# Patient Record
Sex: Female | Born: 1970 | Race: Black or African American | Hispanic: No | Marital: Married | Smoking: Never smoker
Health system: Southern US, Community
[De-identification: ages and names within clinical notes are randomized; demographics above are authoritative.]

## PROBLEM LIST (undated history)

## (undated) DIAGNOSIS — R011 Cardiac murmur, unspecified: Secondary | ICD-10-CM

## (undated) DIAGNOSIS — D649 Anemia, unspecified: Secondary | ICD-10-CM

## (undated) DIAGNOSIS — R87619 Unspecified abnormal cytological findings in specimens from cervix uteri: Secondary | ICD-10-CM

## (undated) DIAGNOSIS — IMO0002 Reserved for concepts with insufficient information to code with codable children: Secondary | ICD-10-CM

## (undated) DIAGNOSIS — I341 Nonrheumatic mitral (valve) prolapse: Secondary | ICD-10-CM

## (undated) DIAGNOSIS — Z5189 Encounter for other specified aftercare: Secondary | ICD-10-CM

## (undated) DIAGNOSIS — N946 Dysmenorrhea, unspecified: Secondary | ICD-10-CM

## (undated) HISTORY — DX: Dysmenorrhea, unspecified: N94.6

## (undated) HISTORY — PX: DILATION AND CURETTAGE OF UTERUS: SHX78

## (undated) HISTORY — DX: Reserved for concepts with insufficient information to code with codable children: IMO0002

## (undated) HISTORY — PX: BREAST ENHANCEMENT SURGERY: SHX7

## (undated) HISTORY — DX: Anemia, unspecified: D64.9

## (undated) HISTORY — PX: OTHER SURGICAL HISTORY: SHX169

## (undated) HISTORY — DX: Cardiac murmur, unspecified: R01.1

## (undated) HISTORY — DX: Nonrheumatic mitral (valve) prolapse: I34.1

## (undated) HISTORY — DX: Unspecified abnormal cytological findings in specimens from cervix uteri: R87.619

## (undated) HISTORY — DX: Encounter for other specified aftercare: Z51.89

---

## 1992-05-15 DIAGNOSIS — Z5189 Encounter for other specified aftercare: Secondary | ICD-10-CM

## 1992-05-15 HISTORY — DX: Encounter for other specified aftercare: Z51.89

## 1997-05-15 HISTORY — PX: CRYOTHERAPY: SHX1416

## 2013-01-30 ENCOUNTER — Ambulatory Visit (INDEPENDENT_AMBULATORY_CARE_PROVIDER_SITE_OTHER): Payer: BC Managed Care – PPO | Admitting: Certified Nurse Midwife

## 2013-01-30 ENCOUNTER — Encounter: Payer: Self-pay | Admitting: Certified Nurse Midwife

## 2013-01-30 VITALS — BP 100/64 | HR 64 | Resp 16 | Ht 66.75 in | Wt 135.0 lb

## 2013-01-30 DIAGNOSIS — Z0000 Encounter for general adult medical examination without abnormal findings: Secondary | ICD-10-CM

## 2013-01-30 DIAGNOSIS — Z800 Family history of malignant neoplasm of digestive organs: Secondary | ICD-10-CM

## 2013-01-30 DIAGNOSIS — Z01419 Encounter for gynecological examination (general) (routine) without abnormal findings: Secondary | ICD-10-CM

## 2013-01-30 NOTE — Progress Notes (Signed)
42 y.o. [id number] Married African American Fe here to establish gyn care and for annual exam. Periods normal, no issues. History of Cryo for abnormal pap smear in 1999. Patient had recent aex with PCP with all labs( normal per patient)  Prior to move here. No health issues today.   Patient's last menstrual period was 01/27/2013.          Sexually active: yes  The current method of family planning is vasectomy.    Exercising: yes  walk, ballet,yoga Smoker:  no  Health Maintenance: Pap:  2012 negative Last aex maybe 2013 MMG:  none Colonoscopy:  none BMD:   none TDaP:  2010 Labs: none Self breast exam: done monthly History of abnormal Pap with Cryo 1999   reports that she has never smoked. She does not have any smokeless tobacco history on file. She reports that she drinks about 0.5 ounces of alcohol per week. She reports that she does not use illicit drugs.  Past Medical History  Diagnosis Date  . Anemia   . Blood transfusion without reported diagnosis 1994  . Dysmenorrhea   . Heart murmur     during pregnancy  . Abnormal Pap smear     Past Surgical History  Procedure Laterality Date  . Cryotherapy  1999  . Uterine polyps removed    . Dilation and curettage of uterus      No current outpatient prescriptions on file.   No current facility-administered medications for this visit.    Family History  Problem Relation Age of Onset  . Cancer Brother     colon  . Thyroid disease Maternal Aunt   . Heart disease Maternal Aunt   . Thyroid disease Maternal Grandmother   . Heart disease Maternal Grandmother   . Heart disease Maternal Aunt     ROS:  Pertinent items are noted in HPI.  Otherwise, a comprehensive ROS was negative.  Exam:   BP 100/64  Pulse 64  Resp 16  Ht 5' 6.75" (1.695 m)  Wt 135 lb (61.236 kg)  BMI 21.31 kg/m2  LMP 01/27/2013 Height: 5' 6.75" (169.5 cm)  Ht Readings from Last 3 Encounters:  01/30/13 5' 6.75" (1.695 m)    General appearance: alert,  cooperative and appears stated age Head: Normocephalic, without obvious abnormality, atraumatic Neck: no adenopathy, supple, symmetrical, trachea midline and thyroid normal to inspection and palpation Lungs: clear to auscultation bilaterally Breasts: normal appearance, no masses or tenderness, No nipple retraction or dimpling, No nipple discharge or bleeding, No axillary or supraclavicular adenopathy, saline implants noted appear intact Heart: regular rate and rhythm Abdomen: soft, non-tender; no masses,  no organomegaly Extremities: extremities normal, atraumatic, no cyanosis or edema Skin: Skin color, texture, turgor normal. No rashes or lesions Lymph nodes: Cervical, supraclavicular, and axillary nodes normal. No abnormal inguinal nodes palpated Neurologic: Grossly normal   Pelvic: External genitalia:  no lesions              Urethra:  normal appearing urethra with no masses, tenderness or lesions              Bartholin's and Skene's: normal                 Vagina: normal appearing vagina with normal color and discharge, no lesions              Cervix: normal, non tender, bleeding with pap only              Pap taken:  yes Bimanual Exam:  Uterus:  normal size, contour, position, consistency, mobility, non-tender and anteverted              Adnexa: normal adnexa and no mass, fullness, tenderness               Rectovaginal: Confirms               Anus:  normal sphincter tone, no lesions  A:  Well Woman with normal exam  Contraception spouse vasectomy  Family history of colon cancer, brother 83  History of abnormal pap with cryo 1999 ?etiology    P:   Reviewed health and wellness pertinent to exam  DIscussed recommendation of colonoscopy with family history. Patient desires referral.  Stressed importance of aex   Pap smear as per guidelines   Mammogram yearly starting now,given information to schedule pap smear taken today with [facility]  counseled on breast self exam, mammography  screening, adequate intake of calcium and vitamin D, diet and exercise  return annually or prn  An After Visit Summary was printed and given to the patient.

## 2013-01-30 NOTE — Patient Instructions (Signed)

## 2013-02-03 LAB — IPS PAP TEST WITH HPV

## 2013-02-04 NOTE — Progress Notes (Signed)
Note reviewed, agree with plan.  Darius Lundberg, MD  

## 2013-07-14 ENCOUNTER — Other Ambulatory Visit: Payer: Self-pay | Admitting: Family Medicine

## 2013-07-14 DIAGNOSIS — Z1231 Encounter for screening mammogram for malignant neoplasm of breast: Secondary | ICD-10-CM

## 2013-08-05 ENCOUNTER — Ambulatory Visit
Admission: RE | Admit: 2013-08-05 | Discharge: 2013-08-05 | Disposition: A | Discharge status: Home / Self Care | Payer: BC Managed Care – PPO | Source: Ambulatory Visit | Attending: Family Medicine | Admitting: Family Medicine

## 2013-08-05 ENCOUNTER — Other Ambulatory Visit: Payer: Self-pay | Admitting: Family Medicine

## 2013-08-05 DIAGNOSIS — Z1231 Encounter for screening mammogram for malignant neoplasm of breast: Secondary | ICD-10-CM

## 2014-01-20 ENCOUNTER — Ambulatory Visit
Admission: RE | Admit: 2014-01-20 | Discharge: 2014-01-20 | Disposition: A | Discharge status: Home / Self Care | Payer: BC Managed Care – PPO | Source: Ambulatory Visit | Attending: Family Medicine | Admitting: Family Medicine

## 2014-01-20 ENCOUNTER — Other Ambulatory Visit: Payer: Self-pay | Admitting: Family Medicine

## 2014-01-20 DIAGNOSIS — R52 Pain, unspecified: Secondary | ICD-10-CM

## 2014-03-16 ENCOUNTER — Encounter: Payer: Self-pay | Admitting: Certified Nurse Midwife

## 2015-07-13 ENCOUNTER — Emergency Department (HOSPITAL_COMMUNITY): Payer: BLUE CROSS/BLUE SHIELD

## 2015-07-13 ENCOUNTER — Emergency Department (HOSPITAL_COMMUNITY)
Admission: EM | Admit: 2015-07-13 | Discharge: 2015-07-13 | Disposition: A | Discharge status: Home / Self Care | Payer: BLUE CROSS/BLUE SHIELD | Attending: Emergency Medicine | Admitting: Emergency Medicine

## 2015-07-13 ENCOUNTER — Encounter (HOSPITAL_COMMUNITY): Payer: Self-pay | Admitting: Cardiology

## 2015-07-13 DIAGNOSIS — Z7982 Long term (current) use of aspirin: Secondary | ICD-10-CM | POA: Diagnosis not present

## 2015-07-13 DIAGNOSIS — R079 Chest pain, unspecified: Secondary | ICD-10-CM

## 2015-07-13 DIAGNOSIS — R0789 Other chest pain: Secondary | ICD-10-CM | POA: Insufficient documentation

## 2015-07-13 DIAGNOSIS — Z862 Personal history of diseases of the blood and blood-forming organs and certain disorders involving the immune mechanism: Secondary | ICD-10-CM | POA: Insufficient documentation

## 2015-07-13 DIAGNOSIS — Z8742 Personal history of other diseases of the female genital tract: Secondary | ICD-10-CM | POA: Diagnosis not present

## 2015-07-13 LAB — BASIC METABOLIC PANEL
ANION GAP: 11 (ref 5–15)
BUN: 8 mg/dL (ref 6–20)
CO2: 25 mmol/L (ref 22–32)
Calcium: 9.1 mg/dL (ref 8.9–10.3)
Chloride: 103 mmol/L (ref 101–111)
Creatinine, Ser: 0.72 mg/dL (ref 0.44–1.00)
GFR calc Af Amer: >60 mL/min (ref >60)
Glucose, Bld: 103 mg/dL — ABNORMAL HIGH (ref 65–99)
Potassium: 4.5 mmol/L (ref 3.5–5.1)
Sodium: 139 mmol/L (ref 135–145)

## 2015-07-13 LAB — CBC
HCT: 35.9 % — ABNORMAL LOW (ref 36.0–46.0)
Hemoglobin: 10.9 g/dL — ABNORMAL LOW (ref 12.0–15.0)
MCH: 24.9 pg — ABNORMAL LOW (ref 26.0–34.0)
MCHC: 30.4 g/dL (ref 30.0–36.0)
MCV: 82 fL (ref 78.0–100.0)
Platelets: 349 10*3/uL (ref 150–400)
RBC: 4.38 MIL/uL (ref 3.87–5.11)
RDW: 14.3 % (ref 11.5–15.5)
WBC: 6.7 10*3/uL (ref 4.0–10.5)

## 2015-07-13 LAB — I-STAT TROPONIN, ED
TROPONIN I, POC: 0.01 ng/mL (ref 0.00–0.08)
Troponin i, poc: 0 ng/mL (ref 0.00–0.08)

## 2015-07-13 NOTE — ED Notes (Signed)
Pt reports centralized chest pain that started yesterday. Denies any SOB or n/v. Does hurt with palpation.

## 2015-07-13 NOTE — ED Provider Notes (Signed)
CSN: [id number]     Arrival date & time 07/13/15  1728 History  By signing my name below, I, Stephania Fragmin, attest that this documentation has been prepared under the direction and in the presence of Harlene Ramus, [place]. Electronically Signed: Stephania Fragmin, ED Scribe. 07/13/2015. 8:38 PM.    Chief Complaint  Patient presents with  . Chest Pain   The history is provided by the patient. No language interpreter was used.    HPI Comments: Stephanie Palmer is a 45 y.o. female who presents to the Emergency Department complaining of intermittent episodes of dull, occasionally heavy, non-radiating, right-sided chest pain lasting for minutes at a time, that began yesterday morning while patient was sitting in bed. She denies any new exercises, heavy lifting, or strenuous activity. Nothing seems to trigger her chest pain. Denies any aggravating or alleviating factors. Cough, deep inspiration, or twisting do not affect her pain. She states she not taken any medications for her symptoms. She denies a history of any chronic medical conditions. She notes a maternal history of MI >70. She denies fever, chills, diaphoresis, body aches, SOB, nasal congestion, cough, wheezing, palpitations, abdominal pain, nausea, vomiting, or weakness. Denies taking any medications prior to arrival.  PCP: Rachell Cipro, MD  Past Medical History  Diagnosis Date  . Anemia   . Blood transfusion without reported diagnosis 1994  . Dysmenorrhea   . Heart murmur     during pregnancy  . Abnormal Pap smear    Past Surgical History  Procedure Laterality Date  . Cryotherapy  1999  . Uterine polyps removed    . Dilation and curettage of uterus     Family History  Problem Relation Age of Onset  . Cancer Brother     colon  . Thyroid disease Maternal Aunt   . Heart disease Maternal Aunt   . Thyroid disease Maternal Grandmother   . Heart disease Maternal Grandmother   . Heart disease Maternal Aunt    Social History  Substance Use  Topics  . Smoking status: Never Smoker   . Smokeless tobacco: None  . Alcohol Use: 0.5 oz/week    1 drink(s) per week   OB History    Gravida Para Term Preterm AB TAB SAB Ectopic Multiple Living   2 2 2       2      Review of Systems  Constitutional: Negative for fever, chills and diaphoresis.  HENT: Negative for congestion.   Respiratory: Negative for cough, shortness of breath and wheezing.   Cardiovascular: Positive for chest pain. Negative for palpitations.  Gastrointestinal: Negative for nausea, vomiting and abdominal pain.  Musculoskeletal: Negative for myalgias.  Neurological: Negative for weakness.  All other systems reviewed and are negative.     Allergies  Review of patient's allergies indicates no known allergies.  Home Medications   Prior to Admission medications   Medication Sig Start Date End Date Taking? Authorizing Provider  aspirin EC 81 MG tablet Take 81 mg by mouth daily.   Yes Historical Provider, MD  ibuprofen (ADVIL,MOTRIN) 200 MG tablet Take 600 mg by mouth every 6 (six) hours as needed for moderate pain.   Yes Historical Provider, MD   BP 114/76 mmHg  Pulse 59  Temp(Src) 97.8 F (36.6 C) (Oral)  Resp 18  Wt 61.236 kg  SpO2 100%  LMP 07/12/2015 Physical Exam  Constitutional: She is oriented to person, place, and time. She appears well-developed and well-nourished.  HENT:  Head: Normocephalic and atraumatic.  Eyes:  Conjunctivae and EOM are normal. Right eye exhibits no discharge. Left eye exhibits no discharge. No scleral icterus.  Cardiovascular: Normal rate, regular rhythm, normal heart sounds and intact distal pulses.  Exam reveals no gallop and no friction rub.   No murmur heard. Pulmonary/Chest: Effort normal and breath sounds normal. No respiratory distress. She has no wheezes. She has no rales. She exhibits tenderness.  Right-sided chest wall tenderness to palpation.  Abdominal: Soft. Bowel sounds are normal. She exhibits no distension  and no mass. There is no tenderness. There is no rebound and no guarding.  Musculoskeletal: She exhibits no edema.  Neurological: She is alert and oriented to person, place, and time.  Skin: Skin is warm and dry.  Nursing note and vitals reviewed.   ED Course  Procedures (including critical care time)  DIAGNOSTIC STUDIES: Oxygen Saturation is 99% on RA, normal by my interpretation.    COORDINATION OF CARE: 7:38 PM - Lab test results reviewed with pt. Discussed treatment plan with pt at bedside. Pt verbalized understanding and agreed to plan.   Labs Review Labs Reviewed  BASIC METABOLIC PANEL - Abnormal; Notable for the following:    Glucose, Bld 103 (*)    All other components within normal limits  CBC - Abnormal; Notable for the following:    Hemoglobin 10.9 (*)    HCT 35.9 (*)    MCH 24.9 (*)    All other components within normal limits  I-STAT TROPOININ, ED  I-STAT TROPOININ, ED    Imaging Review Dg Chest 2 View  07/13/2015  CLINICAL DATA:  Acute mid and right chest pain and tightness for 2 days with shortness of breath EXAM: CHEST  2 VIEW COMPARISON:  01/20/2014 FINDINGS: The heart size and mediastinal contours are within normal limits. Both lungs are clear. The visualized skeletal structures are unremarkable. External artifact overlies the upper lobes. IMPRESSION: No active cardiopulmonary disease. Electronically Signed   By: Jerilynn Mages.  Shick M.D.   On: 07/13/2015 19:20   I have personally reviewed and evaluated these images and lab results as part of my medical decision-making.   EKG Interpretation   Date/Time:  Tuesday July 13 2015 17:45:15 EST Ventricular Rate:  74 PR Interval:  158 QRS Duration: 72 QT Interval:  374 QTC Calculation: 415 R Axis:   81 Text Interpretation:  Normal sinus rhythm Cannot rule out Anterior infarct  , age undetermined Abnormal ECG No old tracing to compare Confirmed by  FLOYD MD, DANIEL [phone]) on 07/13/2015 7:54:33 PM      MDM    Final diagnoses:  Chest pain, unspecified chest pain type   Patient presents with intermittent right-sided chest wall pain that began yesterday morning, denies any associated symptoms. VSS. Exam revealed mild tenderness over right anterior chest wall, remaining exam unremarkable. EKG showed normal sinus rhythm. Troponin negative. Labs unremarkable. Chest x-ray negative. Delta troponin negative. I have a low suspicion for ACS, PE, dissection, or other acute cardiac event at this time. I suspect patient's pain is likely chest wall pain due to musculoskeletal in etiology. Plan to discharge patient home with symptomatic treatment. Advised patient to follow up with her PCP in 3-4 days.  Evaluation does not show pathology requring ongoing emergent intervention or admission. Pt is hemodynamically stable and mentating appropriately. Discussed findings/results and plan with patient/guardian, who agrees with plan. All questions answered. Return precautions discussed and outpatient follow up given.     I personally performed the services described in this documentation, which was scribed  in my presence. The recorded information has been reviewed and is accurate.     Chesley Noon [place], [place] 07/13/15 2145  Deno Etienne, DO 07/13/15 2220

## 2015-07-13 NOTE — Discharge Instructions (Signed)
You may take 800 mg ibuprofen 3 times daily as needed for pain relief. Please follow up with a primary care provider from the Resource Guide provided below in 3-4 days. Please return to the Emergency Department if symptoms worsen or new onset of fever, chills, shortness of breath, cough, palpitations, abdominal pain, vomiting, numbness, tingling, weakness, syncope, seizure.   Emergency Department Resource Guide 1) Find a Doctor and Pay Out of Pocket Although you won't have to find out who is covered by your insurance plan, it is a good idea to ask around and get recommendations. You will then need to call the office and see if the doctor you have chosen will accept you as a new patient and what types of options they offer for patients who are self-pay. Some doctors offer discounts or will set up payment plans for their patients who do not have insurance, but you will need to ask so you aren't surprised when you get to your appointment.  2) Contact Your Local Health Department Not all health departments have doctors that can see patients for sick visits, but many do, so it is worth a call to see if yours does. If you don't know where your local health department is, you can check in your phone book. The CDC also has a tool to help you locate your state's health department, and many state websites also have listings of all of their local health departments.  3) Find a [facility] If your illness is not likely to be very severe or complicated, you may want to try a walk in clinic. These are popping up all over the country in pharmacies, drugstores, and shopping centers. They're usually staffed by nurse practitioners or physician assistants that have been trained to treat common illnesses and complaints. They're usually fairly quick and inexpensive. However, if you have serious medical issues or chronic medical problems, these are probably not your best option.  No Primary Care Doctor: - Call Health  Connect at  [phone] - they can help you locate a primary care doctor that  accepts your insurance, provides certain services, etc. - Physician Referral Service- [phone]  Chronic Pain Problems: Organization         Address  Phone   Notes  [facility]  [phone] Patients need to be referred by their primary care doctor.   Medication Assistance: Organization         Address  Phone   Notes  [facility] [address] [place], [place] [address] [phone] --Must be a resident of [facility] -- Must have NO insurance coverage whatsoever (no Medicaid/ Medicare, etc.) -- The pt. MUST have a primary care doctor that directs their care regularly and follows them in the community   Med[facility]ist  [phone]   [employer]  [phone]    Agencies that provide inexpensive medical care: Organization         Address  Phone   Notes  [place]  [phone]   [facility]    [phone]   [facility] [place], [place] [address] [phone]   [place] [address], [place] [phone]   Planned Parenthood    [phone]   [facility]    [phone]   [place] and [place], [place] Phone:  [phone],  Fax:  [phone] Hours of Operation:  9 am - 6 pm, M-F.  Also accepts Medicaid/Medicare and self-pay.  [facility] for [place], [address], [place] Phone: [phone], Fax: [phone]. Hours of Operation:  8:30 am - 5:30 pm, M-F.  Also accepts Medicaid and self-pay.  [facility] [place] [address] [place] Phone: [phone]   [place], [place], [place] [phone], Ext. [phone] Mondays & Thursdays: 7-9 AM.  First 15 patients are seen on a first come, first serve basis.     [place] Providers:  Organization         Address  Phone   Notes  [facility] [address], [place] [phone] Also accepts self-pay patients.  [facility] [address], [place]  [phone]   [place], [address], [place] [phone]   [facility] [address], [place] [phone]   Lucianne Lei [address], [place]   [phone] Only accepts [place] Access [place] patients after they have their name applied to their card.   Self-Pay (no insurance) in [facility]:  Organization         Address  Phone   Notes  Sickle Cell Patients, [facility] [place] [phone]   [facility] [place] [phone]   [facility] [place]  [place], [place], [place] [phone]   [facility]/Dr. Osei-Bonsu  [address], [place] or [address], [place] [phone] Phone number for both [place] and [place] locations is the same.  Urgent Medical and [facility] [address] [place] [phone]   [facility] [address] [place] or [address] [phone]   [facility] [address], [place] [phone], phone; [phone], fax Sees patients 1st and 3rd Saturday of every month.  Must not qualify for public or private insurance (i.e. Medicaid, Medicare, [place] Health Choice, Veterans' Benefits)  Household income should be no more than 200% of the poverty level The clinic cannot treat you if you are pregnant or think you are pregnant  Sexually transmitted diseases are not treated at the clinic.    Dental Care: Organization         Address  Phone  Notes  [facility]  Department of [facility] [place] [phone] Accepts children up to age 78 who are enrolled in [place] or [place]; pregnant women with a Medicaid card; and children who have applied for Medicaid or [place] Health Choice, but were declined, whose parents can pay a reduced fee at time of service.  [facility] Department of [facility]  [address], [place] [phone] Accepts children up to age 26 who are enrolled in [place] or [place]; pregnant women with a Medicaid card; and children who have applied for Medicaid or [place] Health Choice, but were declined, whose parents can pay a reduced fee at time of service.  [place] Adult Dental Access PROGRAM  [place] [phone] Patients are seen by appointment only. Walk-ins are not  accepted. [place] will see patients 36 years of age and older. Monday - Tuesday (8am-5pm) Most Wednesdays (8:30-5pm) $30 per visit, cash only  [facility] Adult Dental Access PROGRAM  [address], [facility] [phone] Patients are seen by appointment only. Walk-ins are not accepted. [place] will see patients 36 years of age and older. One Wednesday Evening (Monthly: Volunteer Based).  $30 per visit, cash only  [place]  [phone] for adults; Children under age 81, call Graduate Pediatric Dentistry at [phone]. Children aged 62-14, please call [phone] to request a pediatric application.  Dental services are provided in all areas of dental care including fillings, crowns and bridges, complete and partial dentures, implants, gum treatment, root canals, and extractions. Preventive care is also provided. Treatment is provided to both adults and children. Patients are selected via a lottery and there is often a waiting list.   [facility] [address] [place]  [phone]  [email or web address].[email or web address]   [facility] [address] [place], [place] [phone], Ext. [phone] Second and Fourth Thursday of each month, opens at 6:30 AM; Clinic ends at 9 AM.  Patients are seen on a first-come first-served basis, and a limited number are seen during each clinic.   [facility]  [address] Hillard Danker [place], [place] [phone]   Eligibility Requirements You must have lived in [place], [place], or [place] counties for at least the last three months.   You cannot be eligible for state or federal sponsored [employer], including [employer], [place], or [employer].   You generally cannot be eligible for healthcare insurance through your employer.    How to apply: Eligibility screenings are held every Tuesday and Wednesday afternoon from 1:00 pm until 4:00 pm. You do not need an appointment for the interview!  [facility] [address] [place], [place]   [place]  [place] Department  [place]  [phone]    Behavioral Health Resources in the Community: Intensive Outpatient Programs Organization         Address  Phone  Notes  [facility] [place]. [address] [place], [place] [phone]   [facility] Outpatient [address], [place], [place]   ADS: Alcohol & Drug Svcs [address], [place], [place]   [place] [address]  [place], [place] or [phone]   Substance Abuse Resources Organization         Address  Phone  Notes  Alcohol and Drug Services  [phone]   [place]  [phone]   [facility] [place]   Chinita Pester  [phone]   Residential & Outpatient Substance Abuse Program  [phone]   Psychological Services Organization          Address  Phone  Notes  [facility] [place]  [place]  [phone]   [place] [address], [place] or [phone]    Mobile Crisis Teams Organization         Address  Phone  Notes  [facility], Mobile Crisis Care Unit  [phone]   [facility]  [address] [place], [place]   [place] [address], [place] [phone]    Self-Help/Support Groups  Organization         Address  Phone             Notes  [facility]ental [facility]oc. of [place] - variety of support groups  [place] Call for more information  Narcotics Anonymous (NA), [facility] [address], [employer] [place]  2 meetings at this location   Special educational needs teacher         Address  Phone  Notes  [facility] [place],    [place]  [phone]   [facility]  [address] [place] [id number], [place], [place]   [place], [place] [phone] Admissions: 8am-3pm M-F  Incentives Substance [place] [address],    [place], [place] [phone]   [facility] [address] [place], [place], [place]   [facility] [address],  [place], [place]   [facility] Programs - Intensive Outpatient [address], [place], [place]   [facility] ([place]  [place], [place] [phone] or [phone]   [facility] Treatment Services (RTS) [address] [place], [place] Accepts Medicaid  [facility] [address],  [place] [phone] Substance Abuse/Addiction Treatment   [facility] Organization         Address  Phone  Notes  [facility]  [phone]   Domenic Schwab, PhD [address] Arlis [place], [place]   [phone] or [phone]   [place], [place] [phone]   [facility] [address], [place], [place] [phone] Insurance/Medicaid/sponsorship through [facility] and Families [address], [address] [place]                                    [place], [place] [phone] [place] [address], [place] [phone]    Dr. Adele Schilder  [phone]   [facility] Dept. 1) [address] [place] 2) [place] 3)  [address], [place] [phone]  [phone]   [place] [phone] or [phone] (After Hours)

## 2015-07-13 NOTE — ED Notes (Signed)
Pt is in stable condition upon d/c and ambulates from ED. 

## 2015-07-13 NOTE — ED Notes (Signed)
Pt is in radiology.

## 2017-03-21 ENCOUNTER — Other Ambulatory Visit: Payer: Self-pay | Admitting: Family Medicine

## 2017-03-21 DIAGNOSIS — Z1231 Encounter for screening mammogram for malignant neoplasm of breast: Secondary | ICD-10-CM

## 2017-07-10 DIAGNOSIS — R635 Abnormal weight gain: Secondary | ICD-10-CM | POA: Diagnosis not present

## 2017-07-10 DIAGNOSIS — N951 Menopausal and female climacteric states: Secondary | ICD-10-CM | POA: Diagnosis not present

## 2017-07-12 DIAGNOSIS — R7303 Prediabetes: Secondary | ICD-10-CM | POA: Diagnosis not present

## 2017-07-12 DIAGNOSIS — E559 Vitamin D deficiency, unspecified: Secondary | ICD-10-CM | POA: Diagnosis not present

## 2017-07-12 DIAGNOSIS — E7800 Pure hypercholesterolemia, unspecified: Secondary | ICD-10-CM | POA: Diagnosis not present

## 2017-07-12 DIAGNOSIS — Z1331 Encounter for screening for depression: Secondary | ICD-10-CM | POA: Diagnosis not present

## 2017-07-12 DIAGNOSIS — Z87898 Personal history of other specified conditions: Secondary | ICD-10-CM | POA: Diagnosis not present

## 2018-01-28 DIAGNOSIS — Z23 Encounter for immunization: Secondary | ICD-10-CM | POA: Diagnosis not present

## 2018-01-28 DIAGNOSIS — Z6824 Body mass index (BMI) 24.0-24.9, adult: Secondary | ICD-10-CM | POA: Diagnosis not present

## 2018-01-28 DIAGNOSIS — R41840 Attention and concentration deficit: Secondary | ICD-10-CM | POA: Diagnosis not present

## 2018-02-12 DIAGNOSIS — J069 Acute upper respiratory infection, unspecified: Secondary | ICD-10-CM | POA: Diagnosis not present

## 2018-02-12 DIAGNOSIS — Z6824 Body mass index (BMI) 24.0-24.9, adult: Secondary | ICD-10-CM | POA: Diagnosis not present

## 2018-03-19 ENCOUNTER — Ambulatory Visit (INDEPENDENT_AMBULATORY_CARE_PROVIDER_SITE_OTHER): Payer: BLUE CROSS/BLUE SHIELD | Admitting: Mental Health

## 2018-03-19 DIAGNOSIS — F900 Attention-deficit hyperactivity disorder, predominantly inattentive type: Secondary | ICD-10-CM | POA: Diagnosis not present

## 2018-03-19 NOTE — Progress Notes (Signed)
[facility] Counselor Initial Adult Exam- Part I  Name: Stephanie Palmer Date: 03/19/2018 MRN: [id number] DOB: [birth year] PCP: Patient, No Pcp Per  Time spent: 45 minutes   Guardian/Payee patient   Paperwork requested:  No   Reason for Visit /Presenting Problem: No chief complaint on file.   Mental Status Exam:   Appearance:   Casual     Behavior:  Appropriate and Sharing  Motor:  Normal  Speech/Language:   Clear and Coherent  Affect:  Appropriate  Mood:  normal  Thought process:  normal  Thought content:    WNL  Sensory/Perceptual disturbances:    WNL  Orientation:  oriented to person, place and time/date  Attention:  Good  Concentration:  Good  Memory:  WNL  Fund of knowledge:   Good  Insight:    Good  Judgment:   Good  Impulse Control:  Good   Reported Symptoms:  high distractibility, lack of focus  Risk Assessment: Danger to Self:  No Self-injurious Behavior: No Danger to Others: No Duty to Warn:no Physical Aggression / Violence:No  Access to Firearms a concern: No  Gang Involvement:No  Patient / guardian was educated about steps to take if suicide or homicide risk level increases between visits: no While future psychiatric events cannot be accurately predicted, the patient does not currently require acute inpatient psychiatric care and does not currently meet [facility] involuntary commitment criteria.  Substance Abuse History: Current substance abuse: No     Past Psychiatric History:   No previous psychological problems have been observed Outpatient Providers: unknown History of Psych Hospitalization: No  Psychological Testing: not recorded    Medical History/Surgical History:not reviewed Past Medical History:  Diagnosis Date  . Abnormal Pap smear   . Anemia   . Blood transfusion without reported diagnosis 1994  . Dysmenorrhea   . Heart murmur    during pregnancy    Past Surgical History:  Procedure Laterality Date  . CRYOTHERAPY  1999  .  DILATION AND CURETTAGE OF UTERUS    . uterine polyps removed      Medications: Current Outpatient Medications  Medication Sig Dispense Refill  . aspirin EC 81 MG tablet Take 81 mg by mouth daily.    Marland Kitchen ibuprofen (ADVIL,MOTRIN) 200 MG tablet Take 600 mg by mouth every 6 (six) hours as needed for moderate pain.     No current facility-administered medications for this visit.     No Known Allergies  Diagnoses:    ICD-10-CM   1. Attention deficit hyperactivity disorder (ADHD), predominantly inattentive type F90.0      Part II to be continued at next session:   No.   Rosary Lively, LPC

## 2018-03-20 DIAGNOSIS — Z0000 Encounter for general adult medical examination without abnormal findings: Secondary | ICD-10-CM | POA: Diagnosis not present

## 2018-03-20 DIAGNOSIS — Z114 Encounter for screening for human immunodeficiency virus [HIV]: Secondary | ICD-10-CM | POA: Diagnosis not present

## 2018-03-20 DIAGNOSIS — Z13220 Encounter for screening for lipoid disorders: Secondary | ICD-10-CM | POA: Diagnosis not present

## 2018-03-20 DIAGNOSIS — Z1329 Encounter for screening for other suspected endocrine disorder: Secondary | ICD-10-CM | POA: Diagnosis not present

## 2018-03-25 DIAGNOSIS — Z0000 Encounter for general adult medical examination without abnormal findings: Secondary | ICD-10-CM | POA: Diagnosis not present

## 2018-03-25 DIAGNOSIS — Z6823 Body mass index (BMI) 23.0-23.9, adult: Secondary | ICD-10-CM | POA: Diagnosis not present

## 2018-03-25 DIAGNOSIS — R1084 Generalized abdominal pain: Secondary | ICD-10-CM | POA: Diagnosis not present

## 2018-03-25 DIAGNOSIS — E782 Mixed hyperlipidemia: Secondary | ICD-10-CM | POA: Diagnosis not present

## 2018-03-25 DIAGNOSIS — Z1151 Encounter for screening for human papillomavirus (HPV): Secondary | ICD-10-CM | POA: Diagnosis not present

## 2018-03-25 DIAGNOSIS — R109 Unspecified abdominal pain: Secondary | ICD-10-CM | POA: Diagnosis not present

## 2018-03-25 DIAGNOSIS — Z6824 Body mass index (BMI) 24.0-24.9, adult: Secondary | ICD-10-CM | POA: Diagnosis not present

## 2018-03-25 DIAGNOSIS — Z01419 Encounter for gynecological examination (general) (routine) without abnormal findings: Secondary | ICD-10-CM | POA: Diagnosis not present

## 2018-03-25 DIAGNOSIS — R739 Hyperglycemia, unspecified: Secondary | ICD-10-CM | POA: Diagnosis not present

## 2018-03-28 DIAGNOSIS — N920 Excessive and frequent menstruation with regular cycle: Secondary | ICD-10-CM | POA: Diagnosis not present

## 2018-03-28 DIAGNOSIS — R9389 Abnormal findings on diagnostic imaging of other specified body structures: Secondary | ICD-10-CM | POA: Diagnosis not present

## 2018-04-03 ENCOUNTER — Ambulatory Visit: Payer: BLUE CROSS/BLUE SHIELD | Admitting: Mental Health

## 2018-04-19 DIAGNOSIS — Z1231 Encounter for screening mammogram for malignant neoplasm of breast: Secondary | ICD-10-CM | POA: Diagnosis not present

## 2018-04-19 DIAGNOSIS — N9489 Other specified conditions associated with female genital organs and menstrual cycle: Secondary | ICD-10-CM | POA: Diagnosis not present

## 2018-04-19 DIAGNOSIS — N920 Excessive and frequent menstruation with regular cycle: Secondary | ICD-10-CM | POA: Diagnosis not present

## 2018-04-24 ENCOUNTER — Ambulatory Visit: Payer: BLUE CROSS/BLUE SHIELD | Admitting: Physician Assistant

## 2018-04-25 DIAGNOSIS — R300 Dysuria: Secondary | ICD-10-CM | POA: Diagnosis not present

## 2018-05-01 ENCOUNTER — Ambulatory Visit: Payer: BLUE CROSS/BLUE SHIELD | Admitting: Mental Health

## 2018-05-16 ENCOUNTER — Encounter (HOSPITAL_BASED_OUTPATIENT_CLINIC_OR_DEPARTMENT_OTHER): Payer: Self-pay | Admitting: *Deleted

## 2018-05-20 ENCOUNTER — Encounter: Payer: Self-pay | Admitting: Physician Assistant

## 2018-05-20 ENCOUNTER — Ambulatory Visit (INDEPENDENT_AMBULATORY_CARE_PROVIDER_SITE_OTHER): Payer: BLUE CROSS/BLUE SHIELD | Admitting: Physician Assistant

## 2018-05-20 VITALS — BP 132/87 | HR 88 | Ht 67.0 in | Wt 155.0 lb

## 2018-05-20 DIAGNOSIS — F902 Attention-deficit hyperactivity disorder, combined type: Secondary | ICD-10-CM

## 2018-05-20 MED ORDER — AMPHETAMINE-DEXTROAMPHET ER 10 MG PO CP24: 10.0000 mg | ORAL_CAPSULE | Freq: Every day | ORAL | 0 refills | Status: DC
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
## 2018-05-21 ENCOUNTER — Telehealth: Payer: Self-pay | Admitting: Physician Assistant

## 2018-05-21 DIAGNOSIS — R9389 Abnormal findings on diagnostic imaging of other specified body structures: Secondary | ICD-10-CM | POA: Diagnosis not present

## 2018-05-21 DIAGNOSIS — N9489 Other specified conditions associated with female genital organs and menstrual cycle: Secondary | ICD-10-CM | POA: Diagnosis not present

## 2018-05-21 NOTE — Telephone Encounter (Signed)
Stephanie Palmer, please call pharmacy and see if they can tell us whether her insurance will pay for Vyvanse before I send a prescription.  If they we will, then canceled the Adderall prescription and I will will E prescribe the Vyvanse.  If they want to pay for the Vyvanse then she should go ahead and pick up the Adderall.  Thanks

## 2018-05-21 NOTE — Telephone Encounter (Signed)
Pt called wants to see if insurance will pay for  Vyvanse. You sent Adderall XR to pharm already. Pt did not pick up. [place]

## 2018-05-22 ENCOUNTER — Other Ambulatory Visit: Payer: Self-pay | Admitting: Obstetrics and Gynecology

## 2018-05-23 ENCOUNTER — Encounter: Payer: Self-pay | Admitting: Mental Health

## 2018-05-23 ENCOUNTER — Ambulatory Visit (INDEPENDENT_AMBULATORY_CARE_PROVIDER_SITE_OTHER): Payer: BLUE CROSS/BLUE SHIELD | Admitting: Mental Health

## 2018-05-23 DIAGNOSIS — F902 Attention-deficit hyperactivity disorder, combined type: Secondary | ICD-10-CM | POA: Diagnosis not present

## 2018-05-23 NOTE — Progress Notes (Signed)
      [facility] Counselor/Therapist Progress Note  Patient IDFalon Palmer, MRN: [id number],    Date: 05/23/2018  Time Spent: 45 minutes   Treatment Type: Individual Therapy  Reported Symptoms: Notices big improvement since adding adderall per TH. Mood stable. Good rapport.  Mental Status Exam:  Appearance:   Casual     Behavior:  Appropriate, Sharing, Motivated and upbeat positive  Motor:  Normal  Speech/Language:   Clear and Coherent  Affect:  Appropriate  Mood:  euphoric  Thought process:  normal  Thought content:    WNL  Sensory/Perceptual disturbances:    WNL  Orientation:  oriented to person, place and time/date  Attention:  Good  Concentration:  Good  Memory:  WNL  Fund of knowledge:   Good  Insight:    Good  Judgment:   Good  Impulse Control:  Good   Risk Assessment: Danger to Self:  No Self-injurious Behavior: No Danger to Others: No Duty to Warn:no Physical Aggression / Violence:No  Access to Firearms a concern: No  Gang Involvement:No   Subjective: Having hysterectomy in 2 weeks. Actually looking forward to having things improve and having time in recovery at home. Enjoy pet dog. Good self care in walking program daily and spending time in meditation and journaling.   Interventions: Cognitive Behavioral Therapy, Solution-Oriented/Positive Psychology, Insight-Oriented and Interpersonal  Diagnosis:   ICD-10-CM   1. Attention deficit hyperactivity disorder (ADHD), combined type F90.2     Plan: Self care program            Promote nonprofit program in support of women            Increase creative outlets            Restore restful sleep            Validation and support                   Rosary Lively, [facility]

## 2018-05-24 ENCOUNTER — Telehealth: Payer: Self-pay | Admitting: Physician Assistant

## 2018-05-24 NOTE — Telephone Encounter (Signed)
Stephanie Palmer let her know that we can change but she needs to bring the pills to [place] to destroy, and then I can send in another Rx. First though, make sure she's taking it in the morning asap, with food.

## 2018-05-24 NOTE — Telephone Encounter (Signed)
Pt taking correctly. Instructed to bring in bottle of medications and we can then prescribe. Verbalized understanding.

## 2018-05-24 NOTE — Telephone Encounter (Signed)
Pt. Called and said that she wants to go down on the dosage of adderrall due to waking up at night. Or would like try vyvanse or something different. Please call pt at [phone]

## 2018-05-29 ENCOUNTER — Encounter (HOSPITAL_COMMUNITY): Payer: Self-pay

## 2018-05-29 ENCOUNTER — Encounter (HOSPITAL_COMMUNITY)
Admission: RE | Admit: 2018-05-29 | Discharge: 2018-05-29 | Disposition: A | Discharge status: Home / Self Care | Payer: BLUE CROSS/BLUE SHIELD | Source: Ambulatory Visit | Attending: Obstetrics and Gynecology | Admitting: Obstetrics and Gynecology

## 2018-05-29 ENCOUNTER — Other Ambulatory Visit: Payer: Self-pay

## 2018-05-29 DIAGNOSIS — Z01818 Encounter for other preprocedural examination: Secondary | ICD-10-CM | POA: Diagnosis not present

## 2018-05-29 DIAGNOSIS — R011 Cardiac murmur, unspecified: Secondary | ICD-10-CM | POA: Insufficient documentation

## 2018-05-29 DIAGNOSIS — I498 Other specified cardiac arrhythmias: Secondary | ICD-10-CM | POA: Insufficient documentation

## 2018-05-29 LAB — CBC
HCT: 42.7 % (ref 36.0–46.0)
HEMOGLOBIN: 13.1 g/dL (ref 12.0–15.0)
MCH: 27.8 pg (ref 26.0–34.0)
MCHC: 30.7 g/dL (ref 30.0–36.0)
MCV: 90.5 fL (ref 80.0–100.0)
Platelets: 288 10*3/uL (ref 150–400)
RBC: 4.72 MIL/uL (ref 3.87–5.11)
RDW: 12.3 % (ref 11.5–15.5)
WBC: 6.9 10*3/uL (ref 4.0–10.5)
nRBC: 0 % (ref 0.0–0.2)

## 2018-05-29 LAB — BASIC METABOLIC PANEL
ANION GAP: 9 (ref 5–15)
BUN: 9 mg/dL (ref 6–20)
CO2: 24 mmol/L (ref 22–32)
Calcium: 9.2 mg/dL (ref 8.9–10.3)
Chloride: 103 mmol/L (ref 98–111)
Creatinine, Ser: 0.8 mg/dL (ref 0.44–1.00)
GFR calc Af Amer: >60 mL/min (ref >60)
GFR calc non Af Amer: >60 mL/min (ref >60)
GLUCOSE: 101 mg/dL — AB (ref 70–99)
Potassium: 4 mmol/L (ref 3.5–5.1)
Sodium: 136 mmol/L (ref 135–145)

## 2018-05-29 NOTE — Patient Instructions (Signed)
Stephanie Palmer  05/29/2018   Your procedure is scheduled on: 06-06-18    Report to [facility] Main  Entrance    Report to admitting at 11:00 AM    Call this number if you have problems the morning of surgery [phone]    Remember: Do not eat food or drink liquids :After Midnight. You may have a Clear Liquid Diet from Midnight until 7:00 AM. After 7:00 AM, nothing until after surgery.    CLEAR LIQUID DIET   Foods Allowed                                                                     Foods Excluded  Coffee and tea, regular and decaf                             liquids that you cannot  Plain Jell-O in any flavor                                             see through such as: Fruit ices (not with fruit pulp)                                     milk, soups, orange juice  Iced Popsicles                                    All solid food Carbonated beverages, regular and diet                                    Cranberry, grape and apple juices Sports drinks like Gatorade Lightly seasoned clear broth or consume(fat free) Sugar, honey syrup  Sample Menu Breakfast                                Lunch                                     Supper Cranberry juice                    Beef broth                            Chicken broth Jell-O                                     Grape juice  Apple juice Coffee or tea                        Jell-O                                      Popsicle                                                Coffee or tea                        Coffee or tea  _____________________________________________________________________     BRUSH YOUR TEETH MORNING OF SURGERY AND RINSE YOUR MOUTH OUT, NO CHEWING GUM CANDY OR MINTS.     Take these medicines the morning of surgery with A SIP OF WATER: Amphetamine-Dextroamphetamine) Adderall XR                                You may not have any metal on your  body including hair pins and              piercings  Do not wear jewelry, make-up, lotions, powders or perfumes, deodorant             Do not wear nail polish.  Do not shave  48 hours prior to surgery.               Do not bring valuables to the hospital. [place].  Contacts, dentures or bridgework may not be worn into surgery.  Leave suitcase in the car. After surgery it may be brought to your room.     Patients discharged the day of surgery will not be allowed to drive home. IF YOU ARE HAVING SURGERY AND GOING HOME THE SAME DAY, YOU MUST HAVE AN ADULT TO DRIVE YOU HOME AND BE WITH YOU FOR 24 HOURS. YOU MAY GO HOME BY TAXI OR UBER OR ORTHERWISE, BUT AN ADULT MUST ACCOMPANY YOU HOME AND STAY WITH YOU FOR 24 HOURS.    Special Instructions: N/A              Please read over the following fact sheets you were given: _____________________________________________________________________           [facility] - Preparing for Surgery Before surgery, you can play an important role.  Because skin is not sterile, your skin needs to be as free of germs as possible.  You can reduce the number of germs on your skin by washing with CHG (chlorahexidine gluconate) soap before surgery.  CHG is an antiseptic cleaner which kills germs and bonds with the skin to continue killing germs even after washing. Please DO NOT use if you have an allergy to CHG or antibacterial soaps.  If your skin becomes reddened/irritated stop using the CHG and inform your nurse when you arrive at Short Stay. Do not shave (including legs and underarms) for at least 48 hours prior to the first CHG shower.  You may shave your face/neck. Please follow these instructions carefully:  1.  Shower with CHG Soap  the night before surgery and the  morning of Surgery.  2.  If you choose to wash your hair, wash your hair first as usual with your  normal  shampoo.  3.  After you shampoo, rinse your  hair and body thoroughly to remove the  shampoo.                           4.  Use CHG as you would any other liquid soap.  You can apply chg directly  to the skin and wash                       Gently with a scrungie or clean washcloth.  5.  Apply the CHG Soap to your body ONLY FROM THE NECK DOWN.   Do not use on face/ open                           Wound or open sores. Avoid contact with eyes, ears mouth and genitals (private parts).                       Wash face,  Genitals (private parts) with your normal soap.             6.  Wash thoroughly, paying special attention to the area where your surgery  will be performed.  7.  Thoroughly rinse your body with warm water from the neck down.  8.  DO NOT shower/wash with your normal soap after using and rinsing off  the CHG Soap.                9.  Pat yourself dry with a clean towel.            10.  Wear clean pajamas.            11.  Place clean sheets on your bed the night of your first shower and do not  sleep with pets. Day of Surgery : Do not apply any lotions/deodorants the morning of surgery.  Please wear clean clothes to the hospital/surgery center.  FAILURE TO FOLLOW THESE INSTRUCTIONS MAY RESULT IN THE CANCELLATION OF YOUR SURGERY PATIENT SIGNATURE_________________________________  NURSE SIGNATURE__________________________________  ________________________________________________________________________

## 2018-05-30 ENCOUNTER — Other Ambulatory Visit: Payer: Self-pay | Admitting: Physician Assistant

## 2018-05-30 ENCOUNTER — Telehealth: Payer: Self-pay | Admitting: Physician Assistant

## 2018-05-30 MED ORDER — AMPHETAMINE-DEXTROAMPHETAMINE 10 MG PO TABS: 5.0000 mg | ORAL_TABLET | Freq: Every day | ORAL | 0 refills | Status: DC

## 2018-05-30 NOTE — Telephone Encounter (Signed)
PATIENT CAME INTO OFFICE AND DROPPED OF SCRIPT FOR ADDERALL XR10 MG HAVING PROBLEMS SLEEPING WOULD LIKE TO SWITCH MEDS, TO BE SENT TO [facility] ON [address] AND [address]

## 2018-05-30 NOTE — Telephone Encounter (Signed)
The bottle is in your office, [place].  I've sent in the Rx to above pharmacy.  FYI.  No action needed.

## 2018-06-06 ENCOUNTER — Other Ambulatory Visit: Payer: Self-pay

## 2018-06-06 ENCOUNTER — Encounter (HOSPITAL_COMMUNITY)
Admission: RE | Disposition: A | Discharge status: Home / Self Care | Payer: Self-pay | Source: Home / Self Care | Attending: Obstetrics and Gynecology

## 2018-06-06 ENCOUNTER — Encounter (HOSPITAL_COMMUNITY): Payer: Self-pay | Admitting: *Deleted

## 2018-06-06 ENCOUNTER — Ambulatory Visit (HOSPITAL_BASED_OUTPATIENT_CLINIC_OR_DEPARTMENT_OTHER)
Admission: RE | Admit: 2018-06-06 | Discharge: 2018-06-07 | Disposition: A | Discharge status: Home / Self Care | Payer: BLUE CROSS/BLUE SHIELD | Attending: Obstetrics and Gynecology | Admitting: Obstetrics and Gynecology

## 2018-06-06 ENCOUNTER — Ambulatory Visit (HOSPITAL_BASED_OUTPATIENT_CLINIC_OR_DEPARTMENT_OTHER): Payer: BLUE CROSS/BLUE SHIELD | Admitting: Certified Registered"

## 2018-06-06 ENCOUNTER — Ambulatory Visit (HOSPITAL_BASED_OUTPATIENT_CLINIC_OR_DEPARTMENT_OTHER): Payer: BLUE CROSS/BLUE SHIELD | Admitting: Physician Assistant

## 2018-06-06 DIAGNOSIS — N920 Excessive and frequent menstruation with regular cycle: Secondary | ICD-10-CM | POA: Insufficient documentation

## 2018-06-06 DIAGNOSIS — Z79899 Other long term (current) drug therapy: Secondary | ICD-10-CM | POA: Diagnosis not present

## 2018-06-06 DIAGNOSIS — N72 Inflammatory disease of cervix uteri: Secondary | ICD-10-CM | POA: Insufficient documentation

## 2018-06-06 DIAGNOSIS — D252 Subserosal leiomyoma of uterus: Secondary | ICD-10-CM | POA: Insufficient documentation

## 2018-06-06 DIAGNOSIS — N921 Excessive and frequent menstruation with irregular cycle: Secondary | ICD-10-CM | POA: Diagnosis not present

## 2018-06-06 DIAGNOSIS — N888 Other specified noninflammatory disorders of cervix uteri: Secondary | ICD-10-CM | POA: Diagnosis not present

## 2018-06-06 DIAGNOSIS — N840 Polyp of corpus uteri: Secondary | ICD-10-CM | POA: Insufficient documentation

## 2018-06-06 DIAGNOSIS — N946 Dysmenorrhea, unspecified: Secondary | ICD-10-CM | POA: Diagnosis not present

## 2018-06-06 DIAGNOSIS — D259 Leiomyoma of uterus, unspecified: Secondary | ICD-10-CM | POA: Diagnosis not present

## 2018-06-06 DIAGNOSIS — Z90710 Acquired absence of both cervix and uterus: Secondary | ICD-10-CM

## 2018-06-06 HISTORY — PX: ROBOTIC ASSISTED LAPAROSCOPIC HYSTERECTOMY AND SALPINGECTOMY: SHX6379

## 2018-06-06 LAB — HCG, SERUM, QUALITATIVE: PREG SERUM: NEGATIVE

## 2018-06-06 SURGERY — XI ROBOTIC ASSISTED LAPAROSCOPIC HYSTERECTOMY AND SALPINGECTOMY
Anesthesia: General | Site: Abdomen | Laterality: Bilateral

## 2018-06-06 MED ORDER — KETOROLAC TROMETHAMINE 30 MG/ML IJ SOLN
INTRAMUSCULAR | Status: DC | PRN
  Administered 2018-06-06: 30 mg via INTRAMUSCULAR

## 2018-06-06 MED ORDER — ROCURONIUM BROMIDE 10 MG/ML (PF) SYRINGE
PREFILLED_SYRINGE | INTRAVENOUS | Status: DC | PRN
  Administered 2018-06-06: 50 mg via INTRAVENOUS

## 2018-06-06 MED ORDER — MIDAZOLAM HCL 2 MG/2ML IJ SOLN
INTRAMUSCULAR | Status: DC | PRN
  Administered 2018-06-06: 2 mg via INTRAVENOUS

## 2018-06-06 MED ORDER — LIDOCAINE 2% (20 MG/ML) 5 ML SYRINGE
INTRAMUSCULAR | Status: AC
  Filled 2018-06-06: qty 5

## 2018-06-06 MED ORDER — PANTOPRAZOLE SODIUM 40 MG PO TBEC
40.0000 mg | DELAYED_RELEASE_TABLET | Freq: Every day | ORAL | Status: DC
  Administered 2018-06-06: 40 mg via ORAL
  Filled 2018-06-06: qty 1

## 2018-06-06 MED ORDER — FENTANYL CITRATE (PF) 100 MCG/2ML IJ SOLN
25.0000 ug | INTRAMUSCULAR | Status: DC | PRN
  Administered 2018-06-06 (×3): 50 ug via INTRAVENOUS

## 2018-06-06 MED ORDER — AMPHETAMINE-DEXTROAMPHET ER 5 MG PO CP24: 5.0000 mg | ORAL_CAPSULE | Freq: Every day | ORAL | Status: DC | PRN

## 2018-06-06 MED ORDER — ROPIVACAINE HCL 5 MG/ML IJ SOLN
INTRAMUSCULAR | Status: AC
  Filled 2018-06-06: qty 30

## 2018-06-06 MED ORDER — SUGAMMADEX SODIUM 200 MG/2ML IV SOLN
INTRAVENOUS | Status: DC | PRN
  Administered 2018-06-06: 150 mg via INTRAVENOUS

## 2018-06-06 MED ORDER — ACETAMINOPHEN 500 MG PO TABS
1000.0000 mg | ORAL_TABLET | Freq: Once | ORAL | Status: AC
  Administered 2018-06-06: 1000 mg via ORAL
  Filled 2018-06-06: qty 2

## 2018-06-06 MED ORDER — SIMETHICONE 80 MG PO CHEW: 80.0000 mg | CHEWABLE_TABLET | Freq: Four times a day (QID) | ORAL | Status: DC | PRN

## 2018-06-06 MED ORDER — ONDANSETRON HCL 4 MG/2ML IJ SOLN
INTRAMUSCULAR | Status: DC | PRN
  Administered 2018-06-06: 4 mg via INTRAVENOUS

## 2018-06-06 MED ORDER — PHENYLEPHRINE 40 MCG/ML (10ML) SYRINGE FOR IV PUSH (FOR BLOOD PRESSURE SUPPORT)
PREFILLED_SYRINGE | INTRAVENOUS | Status: AC
  Filled 2018-06-06: qty 10

## 2018-06-06 MED ORDER — SODIUM CHLORIDE (PF) 0.9 % IJ SOLN
INTRAMUSCULAR | Status: AC
  Filled 2018-06-06: qty 50

## 2018-06-06 MED ORDER — SODIUM CHLORIDE 0.9 % IJ SOLN
INTRAMUSCULAR | Status: DC | PRN
  Administered 2018-06-06: 10 mL

## 2018-06-06 MED ORDER — BUPIVACAINE HCL (PF) 0.25 % IJ SOLN
INTRAMUSCULAR | Status: DC | PRN
  Administered 2018-06-06: 10 mL

## 2018-06-06 MED ORDER — SUGAMMADEX SODIUM 200 MG/2ML IV SOLN
INTRAVENOUS | Status: AC
  Filled 2018-06-06: qty 2

## 2018-06-06 MED ORDER — FENTANYL CITRATE (PF) 100 MCG/2ML IJ SOLN
INTRAMUSCULAR | Status: AC
  Filled 2018-06-06: qty 4

## 2018-06-06 MED ORDER — CEFAZOLIN SODIUM-DEXTROSE 2-4 GM/100ML-% IV SOLN
2.0000 g | INTRAVENOUS | Status: AC
  Administered 2018-06-06: 2 g via INTRAVENOUS
  Filled 2018-06-06: qty 100

## 2018-06-06 MED ORDER — SCOPOLAMINE 1 MG/3DAYS TD PT72
1.0000 | MEDICATED_PATCH | Freq: Once | TRANSDERMAL | Status: DC
  Administered 2018-06-06: 1.5 mg via TRANSDERMAL
  Filled 2018-06-06: qty 1

## 2018-06-06 MED ORDER — MIDAZOLAM HCL 2 MG/2ML IJ SOLN
INTRAMUSCULAR | Status: AC
  Filled 2018-06-06: qty 2

## 2018-06-06 MED ORDER — LIDOCAINE 2% (20 MG/ML) 5 ML SYRINGE
INTRAMUSCULAR | Status: DC | PRN
  Administered 2018-06-06: 1.5 mg/kg/h via INTRAVENOUS

## 2018-06-06 MED ORDER — ONDANSETRON HCL 4 MG PO TABS: 4.0000 mg | ORAL_TABLET | Freq: Four times a day (QID) | ORAL | Status: DC | PRN

## 2018-06-06 MED ORDER — FENTANYL CITRATE (PF) 250 MCG/5ML IJ SOLN
INTRAMUSCULAR | Status: AC
  Filled 2018-06-06: qty 5

## 2018-06-06 MED ORDER — LIDOCAINE 2% (20 MG/ML) 5 ML SYRINGE
INTRAMUSCULAR | Status: DC | PRN
  Administered 2018-06-06: 80 mg via INTRAVENOUS

## 2018-06-06 MED ORDER — ROCURONIUM BROMIDE 100 MG/10ML IV SOLN
INTRAVENOUS | Status: AC
  Filled 2018-06-06: qty 1

## 2018-06-06 MED ORDER — MENTHOL 3 MG MT LOZG: 1.0000 | LOZENGE | OROMUCOSAL | Status: DC | PRN

## 2018-06-06 MED ORDER — DEXTROSE IN LACTATED RINGERS 5 % IV SOLN
INTRAVENOUS | Status: DC
  Administered 2018-06-06 – 2018-06-07 (×2): via INTRAVENOUS

## 2018-06-06 MED ORDER — OXYCODONE HCL 5 MG PO TABS
5.0000 mg | ORAL_TABLET | ORAL | Status: DC | PRN
  Administered 2018-06-06: 5 mg via ORAL
  Filled 2018-06-06: qty 1

## 2018-06-06 MED ORDER — BUPIVACAINE HCL (PF) 0.25 % IJ SOLN
INTRAMUSCULAR | Status: AC
  Filled 2018-06-06: qty 30

## 2018-06-06 MED ORDER — KETOROLAC TROMETHAMINE 30 MG/ML IJ SOLN
INTRAMUSCULAR | Status: AC
  Filled 2018-06-06: qty 1

## 2018-06-06 MED ORDER — PROPOFOL 10 MG/ML IV BOLUS
INTRAVENOUS | Status: DC | PRN
  Administered 2018-06-06: 170 mg via INTRAVENOUS

## 2018-06-06 MED ORDER — IBUPROFEN 200 MG PO TABS
800.0000 mg | ORAL_TABLET | Freq: Three times a day (TID) | ORAL | Status: DC
  Administered 2018-06-07 (×2): 800 mg via ORAL
  Filled 2018-06-06 (×2): qty 4

## 2018-06-06 MED ORDER — ONDANSETRON HCL 4 MG/2ML IJ SOLN: 4.0000 mg | Freq: Four times a day (QID) | INTRAMUSCULAR | Status: DC | PRN

## 2018-06-06 MED ORDER — LACTATED RINGERS IV SOLN
INTRAVENOUS | Status: DC
  Administered 2018-06-06 (×2): via INTRAVENOUS

## 2018-06-06 MED ORDER — PROMETHAZINE HCL 25 MG/ML IJ SOLN: 6.2500 mg | INTRAMUSCULAR | Status: DC | PRN

## 2018-06-06 MED ORDER — ONDANSETRON HCL 4 MG/2ML IJ SOLN
INTRAMUSCULAR | Status: AC
  Filled 2018-06-06: qty 2

## 2018-06-06 MED ORDER — AMPHETAMINE-DEXTROAMPHETAMINE 5 MG PO TABS: 5.0000 mg | ORAL_TABLET | Freq: Every day | ORAL | Status: DC

## 2018-06-06 MED ORDER — GABAPENTIN 300 MG PO CAPS
300.0000 mg | ORAL_CAPSULE | Freq: Once | ORAL | Status: AC
  Administered 2018-06-06: 300 mg via ORAL
  Filled 2018-06-06: qty 1

## 2018-06-06 MED ORDER — HYDROMORPHONE HCL 1 MG/ML IJ SOLN
INTRAMUSCULAR | Status: AC
  Filled 2018-06-06: qty 1

## 2018-06-06 MED ORDER — DEXAMETHASONE SODIUM PHOSPHATE 10 MG/ML IJ SOLN
INTRAMUSCULAR | Status: DC | PRN
  Administered 2018-06-06: 10 mg via INTRAVENOUS

## 2018-06-06 MED ORDER — HYDROMORPHONE HCL 1 MG/ML IJ SOLN
0.2500 mg | INTRAMUSCULAR | Status: DC | PRN
  Administered 2018-06-06 (×2): 0.5 mg via INTRAVENOUS

## 2018-06-06 MED ORDER — PHENYLEPHRINE 40 MCG/ML (10ML) SYRINGE FOR IV PUSH (FOR BLOOD PRESSURE SUPPORT)
PREFILLED_SYRINGE | INTRAVENOUS | Status: DC | PRN
  Administered 2018-06-06 (×2): 80 ug via INTRAVENOUS

## 2018-06-06 MED ORDER — DEXAMETHASONE SODIUM PHOSPHATE 10 MG/ML IJ SOLN
INTRAMUSCULAR | Status: AC
  Filled 2018-06-06: qty 1

## 2018-06-06 MED ORDER — ARTIFICIAL TEARS OPHTHALMIC OINT
TOPICAL_OINTMENT | OPHTHALMIC | Status: AC
  Filled 2018-06-06: qty 7

## 2018-06-06 MED ORDER — FENTANYL CITRATE (PF) 100 MCG/2ML IJ SOLN
INTRAMUSCULAR | Status: DC | PRN
  Administered 2018-06-06 (×4): 50 ug via INTRAVENOUS

## 2018-06-06 MED ORDER — PROPOFOL 10 MG/ML IV BOLUS
INTRAVENOUS | Status: AC
  Filled 2018-06-06: qty 40

## 2018-06-06 MED ORDER — SODIUM CHLORIDE 0.9 % IR SOLN
Status: DC | PRN
  Administered 2018-06-06: 3000 mL

## 2018-06-06 SURGICAL SUPPLY — 55 items
APPLICATOR ARISTA FLEXITIP XL (MISCELLANEOUS) ×3 IMPLANT
BARRIER ADHS 3X4 INTERCEED (GAUZE/BANDAGES/DRESSINGS) IMPLANT
CANISTER SUCT 3000ML PPV (MISCELLANEOUS) ×3 IMPLANT
CATH FOLEY 3WAY  5CC 16FR (CATHETERS) ×2
CATH FOLEY 3WAY 5CC 16FR (CATHETERS) ×1 IMPLANT
COVER BACK TABLE 60X90IN (DRAPES) ×3 IMPLANT
COVER TIP SHEARS 8 DVNC (MISCELLANEOUS) ×1 IMPLANT
COVER TIP SHEARS 8MM DA VINCI (MISCELLANEOUS) ×2
DECANTER SPIKE VIAL GLASS SM (MISCELLANEOUS) IMPLANT
DEFOGGER SCOPE WARMER CLEARIFY (MISCELLANEOUS) ×3 IMPLANT
DERMABOND ADVANCED (GAUZE/BANDAGES/DRESSINGS) ×2
DERMABOND ADVANCED .7 DNX12 (GAUZE/BANDAGES/DRESSINGS) ×1 IMPLANT
DRAPE ARM DVNC X/XI (DISPOSABLE) ×4 IMPLANT
DRAPE COLUMN DVNC XI (DISPOSABLE) ×1 IMPLANT
DRAPE DA VINCI XI ARM (DISPOSABLE) ×8
DRAPE DA VINCI XI COLUMN (DISPOSABLE) ×2
DURAPREP 26ML APPLICATOR (WOUND CARE) ×3 IMPLANT
ELECT REM PT RETURN 9FT ADLT (ELECTROSURGICAL) ×3
ELECTRODE REM PT RTRN 9FT ADLT (ELECTROSURGICAL) ×1 IMPLANT
GLOVE BIOGEL PI IND STRL 7.0 (GLOVE) ×5 IMPLANT
GLOVE BIOGEL PI INDICATOR 7.0 (GLOVE) ×10
GLOVE ECLIPSE 6.5 STRL STRAW (GLOVE) ×9 IMPLANT
HEMOSTAT ARISTA ABSORB 3G PWDR (HEMOSTASIS) ×3 IMPLANT
IRRIG SUCT STRYKERFLOW 2 WTIP (MISCELLANEOUS) ×3
IRRIGATION SUCT STRKRFLW 2 WTP (MISCELLANEOUS) ×1 IMPLANT
LEGGING LITHOTOMY PAIR STRL (DRAPES) ×3 IMPLANT
NEEDLE INSUFFLATION 120MM (ENDOMECHANICALS) ×3 IMPLANT
OBTURATOR OPTICAL STANDARD 8MM (TROCAR) ×2
OBTURATOR OPTICAL STND 8 DVNC (TROCAR) ×1
OBTURATOR OPTICALSTD 8 DVNC (TROCAR) ×1 IMPLANT
OCCLUDER COLPOPNEUMO (BALLOONS) ×3 IMPLANT
PACK ROBOT WH (CUSTOM PROCEDURE TRAY) ×3 IMPLANT
PACK ROBOTIC GOWN (GOWN DISPOSABLE) ×3 IMPLANT
PACK TRENDGUARD 450 HYBRID PRO (MISCELLANEOUS) ×1 IMPLANT
PAD PREP 24X48 CUFFED NSTRL (MISCELLANEOUS) ×3 IMPLANT
PROTECTOR NERVE ULNAR (MISCELLANEOUS) ×6 IMPLANT
SEAL CANN UNIV 5-8 DVNC XI (MISCELLANEOUS) ×4 IMPLANT
SEAL XI 5MM-8MM UNIVERSAL (MISCELLANEOUS) ×8
SEALER VESSEL DA VINCI XI (MISCELLANEOUS) ×2
SEALER VESSEL EXT DVNC XI (MISCELLANEOUS) ×1 IMPLANT
SET CYSTO W/LG BORE CLAMP LF (SET/KITS/TRAYS/PACK) IMPLANT
SET TRI-LUMEN FLTR TB AIRSEAL (TUBING) ×3 IMPLANT
SUT VIC AB 0 CT1 36 (SUTURE) ×6 IMPLANT
SUT VICRYL 4-0 PS2 18IN ABS (SUTURE) ×9 IMPLANT
SUT VLOC 180 0 9IN  GS21 (SUTURE) ×2
SUT VLOC 180 0 9IN GS21 (SUTURE) ×1 IMPLANT
TIP RUMI ORANGE 6.7MMX12CM (TIP) IMPLANT
TIP UTERINE 5.1X6CM LAV DISP (MISCELLANEOUS) IMPLANT
TIP UTERINE 6.7X10CM GRN DISP (MISCELLANEOUS) IMPLANT
TIP UTERINE 6.7X6CM WHT DISP (MISCELLANEOUS) IMPLANT
TIP UTERINE 6.7X8CM BLUE DISP (MISCELLANEOUS) ×3 IMPLANT
TOWEL OR 17X24 6PK STRL BLUE (TOWEL DISPOSABLE) ×6 IMPLANT
TRENDGUARD 450 HYBRID PRO PACK (MISCELLANEOUS) ×3
TROCAR PORT AIRSEAL 8X120 (TROCAR) ×3 IMPLANT
WATER STERILE IRR 1000ML POUR (IV SOLUTION) ×3 IMPLANT

## 2018-06-06 NOTE — Brief Op Note (Signed)
06/06/2018  3:42 PM  PATIENT:  Stephanie Palmer  48 y.o. female  PRE-OPERATIVE DIAGNOSIS:  Symptomatic Severe Dysmenorrhea, Menorrhagia with regular cycles, fibroid uterus  POST-OPERATIVE DIAGNOSIS:  symptomatic severe dysmeorrhea, menorrhagia  with regular cycles, Fibroid uterus  PROCEDURE:  Da vinci robotic total hysterectomy, bilateral salpingectomy  SURGEON:  Surgeon(s) and Role:    * Servando Salina, MD - Primary  PHYSICIAN ASSISTANT:   ASSISTANTS: Derrell Lolling, CNM   ANESTHESIA:   general Findings: nl appendix. Fibroid uterus, nl tubes and ovaries, nl liver edge EBL:  20 mL   BLOOD ADMINISTERED:none  DRAINS: none   LOCAL MEDICATIONS USED:  MARCAINE     SPECIMEN:  Source of Specimen:  uterus with cervix, tubes  DISPOSITION OF SPECIMEN:  PATHOLOGY  COUNTS:  YES  TOURNIQUET:  * No tourniquets in log *  DICTATION: .Other Dictation: Dictation Number [id number]  PLAN OF CARE: Admit for overnight observation  PATIENT DISPOSITION:  PACU - hemodynamically stable.   Delay start of Pharmacological VTE agent (>24hrs) due to surgical blood loss or risk of bleeding: no

## 2018-06-06 NOTE — Transfer of Care (Signed)
Immediate Anesthesia Transfer of Care Note  Patient: Danaysha [name] Streicher  Procedure(s) Performed: Procedure(s) (LRB): XI ROBOTIC ASSISTED LAPAROSCOPIC HYSTERECTOMY AND SALPINGECTOMY (Bilateral)  Patient Location: PACU  Anesthesia Type: General  Level of Consciousness: awake, oriented, sedated and patient cooperative  Airway & Oxygen Therapy: Patient Spontanous Breathing and Patient connected to face mask oxygen  Post-op Assessment: Report given to PACU RN and Post -op Vital signs reviewed and stable  Post vital signs: Reviewed and stable  Complications: No apparent anesthesia complications  Last Vitals:  Vitals Value Taken Time  BP 128/87 06/06/2018  3:50 PM  Temp    Pulse 69 06/06/2018  3:49 PM  Resp 17 06/06/2018  3:51 PM  SpO2 100 % 06/06/2018  3:49 PM  Vitals shown include unvalidated device data.  Last Pain:  Vitals:   06/06/18 1129  TempSrc:   PainSc: 0-No pain

## 2018-06-06 NOTE — Anesthesia Preprocedure Evaluation (Addendum)
Anesthesia Evaluation  Patient identified by MRN, date of birth, ID band Patient awake    Reviewed: Allergy & Precautions, NPO status , Patient's Chart, lab work & pertinent test results  Airway Mallampati: II  TM Distance: >3 FB Neck ROM: Full    Dental  (+) Teeth Intact, Dental Advisory Given   Pulmonary neg pulmonary ROS,    Pulmonary exam normal breath sounds clear to auscultation       Cardiovascular Exercise Tolerance: Good Normal cardiovascular exam+ Valvular Problems/Murmurs MVP  Rhythm:Regular Rate:Normal     Neuro/Psych negative neurological ROS  negative psych ROS   GI/Hepatic negative GI ROS, Neg liver ROS,   Endo/Other  negative endocrine ROS  Renal/GU negative Renal ROS     Musculoskeletal negative musculoskeletal ROS (+)   Abdominal   Peds  (+) ATTENTION DEFICIT DISORDER WITHOUT HYPERACTIVITY Hematology  (+) Blood dyscrasia, anemia ,   Anesthesia Other Findings Day of surgery medications reviewed with the patient.  Reproductive/Obstetrics Symptomatic Severe Dysmenorrhea, Menorrhagia                            Anesthesia Physical Anesthesia Plan  ASA: II  Anesthesia Plan: General   Post-op Pain Management:    Induction: Intravenous  PONV Risk Score and Plan: 4 or greater and Diphenhydramine, Scopolamine patch - Pre-op, Midazolam, Dexamethasone and Ondansetron  Airway Management Planned: Oral ETT  Additional Equipment:   Intra-op Plan:   Post-operative Plan: Extubation in OR  Informed Consent: I have reviewed the patients History and Physical, chart, labs and discussed the procedure including the risks, benefits and alternatives for the proposed anesthesia with the patient or authorized representative who has indicated his/her understanding and acceptance.     Dental advisory given  Plan Discussed with: CRNA  Anesthesia Plan Comments:          Anesthesia Quick Evaluation

## 2018-06-06 NOTE — Anesthesia Procedure Notes (Signed)
Performed by: Cathan Gearin D, CRNA       

## 2018-06-06 NOTE — Anesthesia Procedure Notes (Signed)
Procedure Name: Intubation Date/Time: 06/06/2018 1:41 PM Performed by: Suan Halter, CRNA Pre-anesthesia Checklist: Patient identified, Emergency Drugs available, Suction available and Patient being monitored Patient Re-evaluated:Patient Re-evaluated prior to induction Oxygen Delivery Method: Circle system utilized Preoxygenation: Pre-oxygenation with 100% oxygen Induction Type: IV induction Ventilation: Mask ventilation without difficulty Laryngoscope Size: Mac and 3 Grade View: Grade I Tube type: Oral Tube size: 7.0 mm Number of attempts: 1 Airway Equipment and Method: Stylet and Oral airway Placement Confirmation: ETT inserted through vocal cords under direct vision,  positive ETCO2 and breath sounds checked- equal and bilateral Secured at: 22 cm Tube secured with: Tape Dental Injury: Teeth and Oropharynx as per pre-operative assessment

## 2018-06-07 DIAGNOSIS — D252 Subserosal leiomyoma of uterus: Secondary | ICD-10-CM | POA: Diagnosis not present

## 2018-06-07 DIAGNOSIS — N72 Inflammatory disease of cervix uteri: Secondary | ICD-10-CM | POA: Diagnosis not present

## 2018-06-07 DIAGNOSIS — N840 Polyp of corpus uteri: Secondary | ICD-10-CM | POA: Diagnosis not present

## 2018-06-07 DIAGNOSIS — N921 Excessive and frequent menstruation with irregular cycle: Secondary | ICD-10-CM | POA: Diagnosis not present

## 2018-06-07 DIAGNOSIS — N920 Excessive and frequent menstruation with regular cycle: Secondary | ICD-10-CM | POA: Diagnosis not present

## 2018-06-07 DIAGNOSIS — Z79899 Other long term (current) drug therapy: Secondary | ICD-10-CM | POA: Diagnosis not present

## 2018-06-07 DIAGNOSIS — N946 Dysmenorrhea, unspecified: Secondary | ICD-10-CM | POA: Diagnosis not present

## 2018-06-07 DIAGNOSIS — N888 Other specified noninflammatory disorders of cervix uteri: Secondary | ICD-10-CM | POA: Diagnosis not present

## 2018-06-07 LAB — BASIC METABOLIC PANEL
Anion gap: 6 (ref 5–15)
BUN: 6 mg/dL (ref 6–20)
CALCIUM: 8.6 mg/dL — AB (ref 8.9–10.3)
CO2: 25 mmol/L (ref 22–32)
Chloride: 104 mmol/L (ref 98–111)
Creatinine, Ser: 0.76 mg/dL (ref 0.44–1.00)
GFR calc Af Amer: >60 mL/min (ref >60)
Glucose, Bld: 178 mg/dL — ABNORMAL HIGH (ref 70–99)
Potassium: 4.2 mmol/L (ref 3.5–5.1)
Sodium: 135 mmol/L (ref 135–145)

## 2018-06-07 LAB — CBC
HCT: 39.9 % (ref 36.0–46.0)
Hemoglobin: 12.3 g/dL (ref 12.0–15.0)
MCH: 27.2 pg (ref 26.0–34.0)
MCHC: 30.8 g/dL (ref 30.0–36.0)
MCV: 88.1 fL (ref 80.0–100.0)
NRBC: 0 % (ref 0.0–0.2)
Platelets: 326 10*3/uL (ref 150–400)
RBC: 4.53 MIL/uL (ref 3.87–5.11)
RDW: 12.3 % (ref 11.5–15.5)
WBC: 10.3 10*3/uL (ref 4.0–10.5)

## 2018-06-07 MED ORDER — OXYCODONE HCL 5 MG PO TABS: 5.0000 mg | ORAL_TABLET | ORAL | 0 refills | Status: AC | PRN

## 2018-06-07 MED ORDER — IBUPROFEN 800 MG PO TABS: 800.0000 mg | ORAL_TABLET | Freq: Three times a day (TID) | ORAL | 0 refills | Status: DC

## 2018-06-07 NOTE — Progress Notes (Signed)
Subjective: Patient reports tolerating PO and no problems voiding.    Objective: I have reviewed patient's vital signs.  vital signs, intake and output and labs. Vitals:   06/07/18 0200 06/07/18 0615  BP: (!) 102/58 111/69  Pulse: 79 73  Resp: 16 16  Temp: 98.5 F (36.9 C) 98.5 F (36.9 C)  SpO2: 98% 98%   I/O last 3 completed shifts: In: 2715 [P.O.:240; I.V.:1975; IV Piggyback:500] Out: 2245 [Urine:2225; Blood:20] No intake/output data recorded.  Lab Results  Component Value Date   WBC 10.3 06/07/2018   HGB 12.3 06/07/2018   HCT 39.9 06/07/2018   MCV 88.1 06/07/2018   PLT 326 06/07/2018   Lab Results  Component Value Date   CREATININE 0.76 06/07/2018    EXAM General: alert, cooperative and no distress Resp: clear to auscultation bilaterally Cardio: regular rate and rhythm, S1, S2 normal, no murmur, click, rub or gallop GI: soft, non-tender; bowel sounds normal; no masses,  no organomegaly and incision: clean, dry and intact Extremities: no edema, redness or tenderness in the calves or thighs Vaginal Bleeding: none  Assessment: s/p Procedure(s): XI ROBOTIC ASSISTED LAPAROSCOPIC HYSTERECTOMY AND SALPINGECTOMY: stable, progressing well and tolerating diet  Plan: Advance diet Encourage ambulation Discontinue IV fluids Discharge home  D/c instructions reviewed F/u 2 weeks  LOS: 0 days    Marvene Staff, MD 06/07/2018 7:51 AM    06/07/2018, 7:51 AM

## 2018-06-07 NOTE — Op Note (Signed)
NAME: Stephanie Palmer, ABDULLA MEDICAL RECORD [id number] ACCOUNT [id number] DATE OF BIRTH:01-Aug-1970 FACILITY: [facility] LOCATION: [facility] PHYSICIAN:Bo Teicher A. Gaspar Fowle, MD  OPERATIVE REPORT  DATE OF PROCEDURE:  06/06/2018  PREOPERATIVE DIAGNOSIS:  Menorrhagia with regular cycles, fibroid uterus.  PROCEDURE:  Da Vinci total hysterectomy, bilateral salpingectomy.  POSTOPERATIVE DIAGNOSIS:  Menorrhagia with regular cycles, fibroid uterus.  ANESTHESIA:  General.  SURGEON:  Servando Salina, MD  ASSISTANTS:  Derrell Lolling, CNM.  DESCRIPTION OF PROCEDURE:  Under adequate general anesthesia, the patient was placed in the dorsal lithotomy position.  She was sterilely prepped and draped in the usual fashion.  A 3-way Foley catheter was sterilely placed.  Sims retractor was placed in the vagina and the weighted speculum was placed.  The cervix was parous.  A 0 Vicryl figure-of-eight suture was placed on the anterior and on the posterior lip of the cervix.  The uterus sounded to 8 cm.  A #8 uterine manipulator with a medium RUMI cup  was inserted into the uterus without incident.  The retractors were removed.  Attention was then turned to the abdomen.  Marcaine 0.25% was injected into the supraumbilical.  A small incision was then made.  Veress needle was introduced and tested.   Opening pressure of 5 was noted and 2.3 liters of CO2 was insufflated.  Veress needle was then removed.  An 8 mm disposable trocar with sleeve was introduced into the abdomen followed by a lighted video laparoscope confirmed entry into the abdomen without incident.  Panoramic inspection was notable for normal liver edge, normal gallbladder.  The patient was then placed in Trendelenburg position.  Uterus was noted.  Multiple fibroids seen.  Normal tubes and ovaries were noted bilaterally.   Elongated appendix was noted.  No evidence of endometriosis in the anterior and posterior cul-de-sac.  The additional robotic port site was  placed, two on the right 8 cm apart, one on the left and an 8 mm AirSeal was also placed.  Once this was placed,  the robot was docked.  The instruments were placed as follows.  A vessel sealer in arm.  Arm 3 was the long bipolar instrument and arm 1 the Monopolar scissors.  I then went to the surgical console.  At the surgical console, both ureters were identified  in their path and were peristalsing well.  The procedure was started on the left with the left fallopian tube.  The mesosalpinx being serially clamped, cauterized and cut with removal of that tube.  The retroperitoneal space was then opened on the left.   A window was placed in the posterior leaf of the broad ligament and the left uteroovarian ligament was serially clamped, cauterized, and cut.  The posterior peritoneum was also opened.  The round ligament was clamped, cauterized, and cut.  The anterior  leaf of the broad ligament was also opened, carried around to the vesicouterine peritoneum, which was opened transversely and sharply dissected off the lower uterine segment and displaced inferiorly.  Uterine vessels were then skeletonized.  They were  serially clamped, cauterized and then cut.  The same procedure was performed on the contralateral side.  Once that was done, the vaginal insufflator was started.  The anterior cervicovaginal junction was identified, opened and circumferentially the incision was carried around to detach the cervix of its vaginal attachment.  Once done, the uterus was removed.  The vaginal cuff was inspected.  Small bleeders cauterized.  The instruments were replaced.  Vessel sealers were replaced by a long tip  forceps and the scissors were replaced by large mega  suture needle driver.  A 0 V-Loc was then used to close the vaginal cuff.  Once that was done, the pelvis was irrigated.  The vaginal cuff was digitally inspected.  Good approximation was noted. Arista potato starch was then placed over the vaginal cuff.   The robotic instruments were then removed.  The robot was undocked.  I went back to the patient's bedside sterilely and panoramic inspection showed good hemostasis.  The robotic instruments were then removed.  The incision was then closed with 4-0 Vicryl subcuticular closures and the vagina was palpated.  The vaginal cuff was well approximated and the procedure was felt to be complete.  SPECIMEN:  Uterus with cervix and tubes sent to pathology.  ESTIMATED BLOOD LOSS:  20 ml  INTRAOPERATIVE FLUIDS:  1 liter.  URINE OUTPUT:  400 mL clear yellow urine.  COUNTS:  Sponge and instrument counts x2 was correct.  COMPLICATIONS:  None.  The patient tolerated the procedure well and was transferred to recovery room in stable condition.  TN/NUANCE  D:06/06/2018 T:06/07/2018 JOB:[id number]

## 2018-06-10 ENCOUNTER — Encounter (HOSPITAL_BASED_OUTPATIENT_CLINIC_OR_DEPARTMENT_OTHER): Payer: Self-pay | Admitting: Obstetrics and Gynecology

## 2018-06-18 ENCOUNTER — Encounter (HOSPITAL_COMMUNITY): Payer: Self-pay | Admitting: Anesthesiology

## 2018-06-18 NOTE — Anesthesia Postprocedure Evaluation (Signed)
Anesthesia Post Note  Patient: Stephanie Palmer  Procedure(s) Performed: XI ROBOTIC ASSISTED LAPAROSCOPIC HYSTERECTOMY AND SALPINGECTOMY (Bilateral Abdomen)     Patient location during evaluation: PACU Anesthesia Type: General Level of consciousness: awake and alert Pain management: pain level controlled Vital Signs Assessment: post-procedure vital signs reviewed and stable Respiratory status: spontaneous breathing, nonlabored ventilation, respiratory function stable and patient connected to nasal cannula oxygen Cardiovascular status: blood pressure returned to baseline and stable Postop Assessment: no apparent nausea or vomiting Anesthetic complications: no    Last Vitals:  Vitals:   06/07/18 0615 06/07/18 0819  BP: 111/69 123/73  Pulse: 73 62  Resp: 16 17  Temp: 36.9 C 37.1 C  SpO2: 98% 100%    Last Pain:  Vitals:   06/07/18 0819  TempSrc:   PainSc: [place]

## 2018-06-19 ENCOUNTER — Other Ambulatory Visit: Payer: Self-pay | Admitting: Obstetrics and Gynecology

## 2018-06-20 DIAGNOSIS — B373 Candidiasis of vulva and vagina: Secondary | ICD-10-CM | POA: Diagnosis not present

## 2018-06-20 DIAGNOSIS — N898 Other specified noninflammatory disorders of vagina: Secondary | ICD-10-CM | POA: Diagnosis not present

## 2018-06-24 ENCOUNTER — Ambulatory Visit: Payer: BLUE CROSS/BLUE SHIELD | Admitting: Physician Assistant

## 2018-06-24 DIAGNOSIS — Z6825 Body mass index (BMI) 25.0-25.9, adult: Secondary | ICD-10-CM | POA: Diagnosis not present

## 2018-06-24 DIAGNOSIS — T5894XA Toxic effect of carbon monoxide from unspecified source, undetermined, initial encounter: Secondary | ICD-10-CM | POA: Diagnosis not present

## 2018-07-01 ENCOUNTER — Ambulatory Visit (INDEPENDENT_AMBULATORY_CARE_PROVIDER_SITE_OTHER): Payer: BLUE CROSS/BLUE SHIELD | Admitting: Physician Assistant

## 2018-07-01 ENCOUNTER — Encounter: Payer: Self-pay | Admitting: Physician Assistant

## 2018-07-01 VITALS — BP 131/86 | HR 81

## 2018-07-01 DIAGNOSIS — F902 Attention-deficit hyperactivity disorder, combined type: Secondary | ICD-10-CM | POA: Diagnosis not present

## 2018-07-01 MED ORDER — AMPHETAMINE-DEXTROAMPHETAMINE 10 MG PO TABS: 10.0000 mg | ORAL_TABLET | Freq: Every day | ORAL | 0 refills | Status: DC

## 2018-07-01 NOTE — Progress Notes (Signed)
[facility]  Patient ID: Stephanie Palmer,  MRN: [id number]  PCP: Fanny Bien, MD  Date of Evaluation: 07/01/2018 Time spent:15 minutes  Chief Complaint:  Chief Complaint    ADHD; Follow-up      HISTORY/CURRENT STATUS: HPI Here for routine med check.   After increasing the Adderall and changing to short-acting.  It's helped a lot.  On the XR, she was having a lot of insomnia.  States that attention is good without easy distractibility.  Able to focus on things and finish tasks to completion.  She does not take the Adderall every day, only when she needs it.  Patient denies loss of interest in usual activities and is able to enjoy things.  Denies decreased energy or motivation.  Appetite has not changed.  No extreme sadness, tearfulness, or feelings of hopelessness.  Denies any changes in concentration, making decisions or remembering things.  Denies suicidal or homicidal thoughts.  Denies muscle or joint pain, stiffness, or dystonia.  Denies dizziness, syncope, seizures, numbness, tingling, tremor, tics, unsteady gait, slurred speech, confusion.    Individual Medical History/ Review of Systems: Changes? :Yes  Had a robotic hysterectomy since LOV.  Allergies: Patient has no known allergies.  Current Medications:  Current Outpatient Medications:  .  amphetamine-dextroamphetamine (ADDERALL) 10 MG tablet, Take 0.5-1 tablets (5-10 mg total) by mouth daily with breakfast., Disp: 30 tablet, Rfl: 0 .  ibuprofen (ADVIL,MOTRIN) 800 MG tablet, Take 1 tablet (800 mg total) by mouth every 8 (eight) hours., Disp: 30 tablet, Rfl: 0 .  loratadine (CLARITIN) 10 MG tablet, Take 10 mg by mouth daily., Disp: , Rfl:  .  Multiple Vitamins-Minerals (MULTIVITAMIN WITH MINERALS) tablet, Take 2 tablets by mouth daily. , Disp: , Rfl:  .  Omega-3 1000 MG CAPS, Take 2,000 mg by mouth daily. , Disp: , Rfl:  .  Probiotic Product (PROBIOTIC PO), Take 1 capsule by mouth daily. , Disp: ,  Rfl:  .  amphetamine-dextroamphetamine (ADDERALL) 10 MG tablet, Take 1 tablet (10 mg total) by mouth daily with breakfast., Disp: 30 tablet, Rfl: 0 .  [START ON 07/29/2018] amphetamine-dextroamphetamine (ADDERALL) 10 MG tablet, Take 1 tablet (10 mg total) by mouth daily with breakfast., Disp: 30 tablet, Rfl: 0 .  [START ON 08/28/2018] amphetamine-dextroamphetamine (ADDERALL) 10 MG tablet, Take 1 tablet (10 mg total) by mouth daily with breakfast., Disp: 30 tablet, Rfl: 0 Medication Side Effects: none  Family Medical/ Social History: Changes? No  MENTAL HEALTH EXAM:  Blood pressure 131/86, pulse 81, last menstrual period 05/08/2018.There is no height or weight on file to calculate BMI.  General Appearance: Casual and Well Groomed  Eye Contact:  Good  Speech:  Clear and Coherent  Volume:  Normal  Mood:  Euthymic  Affect:  Appropriate  Thought Process:  Goal Directed  Orientation:  Full (Time, Place, and Person)  Thought Content: Logical   Suicidal Thoughts:  No  Homicidal Thoughts:  No  Memory:  WNL  Judgement:  Good  Insight:  Good  Psychomotor Activity:  Normal  Concentration:  Concentration: Good and Attention Span: Good  Recall:  Good  Fund of Knowledge: Good  Language: Good  Assets:  Desire for Improvement  ADL's:  Intact  Cognition: WNL  Prognosis:  Good    DIAGNOSES:    ICD-10-CM   1. Attention deficit hyperactivity disorder (ADHD), combined type F90.2     Receiving Psychotherapy: No    RECOMMENDATIONS: Continue Adderall 10 mg p.o. every morning as needed. Return  in 3 months or sooner as needed.   Donnal Moat, PA-C

## 2018-07-12 DIAGNOSIS — Z09 Encounter for follow-up examination after completed treatment for conditions other than malignant neoplasm: Secondary | ICD-10-CM | POA: Diagnosis not present

## 2018-08-13 DIAGNOSIS — R41840 Attention and concentration deficit: Secondary | ICD-10-CM | POA: Diagnosis not present

## 2018-08-13 DIAGNOSIS — J069 Acute upper respiratory infection, unspecified: Secondary | ICD-10-CM | POA: Diagnosis not present

## 2018-08-13 DIAGNOSIS — F411 Generalized anxiety disorder: Secondary | ICD-10-CM | POA: Diagnosis not present

## 2018-08-13 DIAGNOSIS — B029 Zoster without complications: Secondary | ICD-10-CM | POA: Diagnosis not present

## 2018-08-16 DIAGNOSIS — F411 Generalized anxiety disorder: Secondary | ICD-10-CM | POA: Diagnosis not present

## 2018-08-16 DIAGNOSIS — R41840 Attention and concentration deficit: Secondary | ICD-10-CM | POA: Diagnosis not present

## 2018-08-16 DIAGNOSIS — J069 Acute upper respiratory infection, unspecified: Secondary | ICD-10-CM | POA: Diagnosis not present

## 2018-08-16 DIAGNOSIS — B029 Zoster without complications: Secondary | ICD-10-CM | POA: Diagnosis not present

## 2018-09-02 ENCOUNTER — Telehealth: Payer: Self-pay | Admitting: Physician Assistant

## 2018-09-02 NOTE — Telephone Encounter (Signed)
Please triage.  At [place], we didn't discuss anx/sleep, unsure what's happened.

## 2018-09-02 NOTE — Telephone Encounter (Signed)
Patient taking adderall, pcp stated she shouldln't take it if she doesn't need it.  Wants to know if she can get a script for something for anxiety and sleep other than the adderall

## 2018-09-02 NOTE — Telephone Encounter (Signed)
Left voicemail to call back with more information on her anxiety and insomnia.

## 2018-09-07 NOTE — Telephone Encounter (Signed)
Has appt Monday 04/27

## 2018-09-09 ENCOUNTER — Ambulatory Visit: Payer: BLUE CROSS/BLUE SHIELD | Admitting: Physician Assistant

## 2018-09-09 ENCOUNTER — Encounter: Payer: Self-pay | Admitting: Physician Assistant

## 2018-09-09 ENCOUNTER — Other Ambulatory Visit: Payer: Self-pay

## 2018-09-09 DIAGNOSIS — F902 Attention-deficit hyperactivity disorder, combined type: Secondary | ICD-10-CM

## 2018-09-09 DIAGNOSIS — F411 Generalized anxiety disorder: Secondary | ICD-10-CM

## 2018-09-09 MED ORDER — ALPRAZOLAM 0.5 MG PO TABS: 0.2500 mg | ORAL_TABLET | Freq: Two times a day (BID) | ORAL | 0 refills | Status: DC | PRN

## 2018-09-09 NOTE — Progress Notes (Signed)
[facility]  Patient ID: Stephanie Palmer,  MRN: [id number]  PCP: Fanny Bien, MD  Date of Evaluation: 09/09/2018 Time spent:15 minutes  Chief Complaint:  Chief Complaint    Anxiety     Virtual Visit via Telephone Note  I connected with patient by a video enabled telemedicine application or telephone, with their informed consent, and verified patient privacy and that I am speaking with the correct person using two identifiers.  I am private, in my home and the patient is home.   I discussed the limitations, risks, security and privacy concerns of performing an evaluation and management service by telephone and the availability of in person appointments. I also discussed with the patient that there may be a patient responsible charge related to this service. The patient expressed understanding and agreed to proceed.   I discussed the assessment and treatment plan with the patient. The patient was provided an opportunity to ask questions and all were answered. The patient agreed with the plan and demonstrated an understanding of the instructions.   The patient was advised to call back or seek an in-person evaluation if the symptoms worsen or if the condition fails to improve as anticipated.  I provided 15 minutes of non-face-to-face time during this encounter.  HISTORY/CURRENT STATUS: HPI  Anxiety is much worse.   Since the Coronavirus pandemic, has had more anxiety. Several family friends in [place] have died from COVID-15.  Patient's father has a Educational psychologist and an AirBNB there and of course there is financial uncertainty that goes along with the quarantine.  Overall, the patient just stays anxious and feels like her chest gets tight the more worried she gets.  She has had trouble sleeping.  She has talked to her family practitioner about it who advise she go off of the Adderall for the time being.  Patient states she is a little bit better but is not completely  gone.  States she over thinks things and has trouble turning her mind off at night and has trouble sleeping.  Her godmother who is a Marine scientist, gave her 3 Xanax, dose uncertain, and the patient has taken 1 at night on 3 separate occasions.  It did seem to calm her down before she went to sleep.  She slept well and woke up the next day feeling refreshed and less anxious.  "I just want to be on the open up.  I am not saying that is what I want or need but I know I need something."  Because of the pandemic, she is at home.  "I am kind of going stir crazy.  Hopefully within the next couple of weeks, we will be able to do more." Patient denies loss of interest in usual activities and is able to enjoy things.  Denies decreased energy or motivation.  Appetite has not changed.  No extreme sadness, tearfulness, or feelings of hopelessness.  Denies any changes in concentration, making decisions or remembering things.  Denies suicidal or homicidal thoughts.  She does have some trouble with her attention span and easily distracted now that she is off of the Adderall.  "It does not really matter that much since I am at home.  I would rather not feel as anxious."  Denies muscle or joint pain, stiffness, or dystonia.  Denies dizziness, syncope, seizures, numbness, tingling, tremor, tics, unsteady gait, slurred speech, confusion.   Individual Medical History/ Review of Systems: Changes? :No    Past medications for mental health diagnoses  include: Unknown  Allergies: Patient has no known allergies.  Current Medications:  Current Outpatient Medications:  .  amphetamine-dextroamphetamine (ADDERALL) 10 MG tablet, Take 1 tablet (10 mg total) by mouth daily with breakfast., Disp: 30 tablet, Rfl: 0 .  ALPRAZolam (XANAX) 0.5 MG tablet, Take 0.5-1 tablets (0.25-0.5 mg total) by mouth 2 (two) times daily as needed for anxiety., Disp: 30 tablet, Rfl: 0 .  amphetamine-dextroamphetamine (ADDERALL) 10 MG tablet, Take 0.5-1  tablets (5-10 mg total) by mouth daily with breakfast. (Patient not taking: Reported on 09/09/2018), Disp: 30 tablet, Rfl: 0 .  amphetamine-dextroamphetamine (ADDERALL) 10 MG tablet, Take 1 tablet (10 mg total) by mouth daily with breakfast. (Patient not taking: Reported on 09/09/2018), Disp: 30 tablet, Rfl: 0 .  amphetamine-dextroamphetamine (ADDERALL) 10 MG tablet, Take 1 tablet (10 mg total) by mouth daily with breakfast. (Patient not taking: Reported on 09/09/2018), Disp: 30 tablet, Rfl: 0 .  ibuprofen (ADVIL,MOTRIN) 800 MG tablet, Take 1 tablet (800 mg total) by mouth every 8 (eight) hours. (Patient not taking: Reported on 09/09/2018), Disp: 30 tablet, Rfl: 0 .  loratadine (CLARITIN) 10 MG tablet, Take 10 mg by mouth daily., Disp: , Rfl:  .  Multiple Vitamins-Minerals (MULTIVITAMIN WITH MINERALS) tablet, Take 2 tablets by mouth daily. , Disp: , Rfl:  .  Omega-3 1000 MG CAPS, Take 2,000 mg by mouth daily. , Disp: , Rfl:  .  Probiotic Product (PROBIOTIC PO), Take 1 capsule by mouth daily. , Disp: , Rfl:  Medication Side Effects: none  Family Medical/ Social History: Changes? Yes in quarantine because of the coronavirus pandemic.  MENTAL HEALTH EXAM:  Last menstrual period 05/08/2018.There is no height or weight on file to calculate BMI.  General Appearance: telephone visit unable to assess  Eye Contact:  unable to assess  Speech:  Clear and Coherent  Volume:  Normal  Mood:  Euthymic  Affect:  unable to assess  Thought Process:  Goal Directed  Orientation:  Full (Time, Place, and Person)  Thought Content: Logical   Suicidal Thoughts:  No  Homicidal Thoughts:  No  Memory:  WNL  Judgement:  Good  Insight:  Good  Psychomotor Activity:  unable to assess  Concentration:  Concentration: Fair and Attention Span: Fair  Recall:  Good  Fund of Knowledge: Good  Language: Good  Assets:  Desire for Improvement  ADL's:  Intact  Cognition: WNL  Prognosis:  Good    DIAGNOSES:    ICD-10-CM    1. Attention deficit hyperactivity disorder (ADHD), combined type F90.2   2. Generalized anxiety disorder F41.1     Receiving Psychotherapy: No    RECOMMENDATIONS:  We discussed different options for anxiety including coping techniques such as deep breathing distractibility, etc.  I agree that a benzodiazepine would be very helpful for her right now.  She is aware that she should not take it with the Adderall because 1 is an upper and 1 is a downer.  She offered to bring the Adderall in to the office for our disposal.  I trust her with this and feel that she will go back on the Adderall once the anxiety of the coronavirus lets up.  She understands.  We also discussed the fact that if the anxiety ends up being chronic, we will have to talk about other additional meds to help prevent the anxiety such as an SSRI or BuSpar. Start Xanax 0.5 mg 1/2-1 twice daily as needed.  Sedation precautions were discussed.  PDMP was reviewed. Adderall 10  mg, on hold for now. Return in 4 weeks.  Donnal Moat, PA-C   This record has been created using [employer].  Chart creation errors have been sought, but may not always have been located and corrected. Such creation errors do not reflect on the standard of medical care.

## 2018-09-30 ENCOUNTER — Ambulatory Visit: Payer: BLUE CROSS/BLUE SHIELD | Admitting: Physician Assistant

## 2018-10-15 ENCOUNTER — Other Ambulatory Visit: Payer: Self-pay | Admitting: Physician Assistant

## 2018-11-06 ENCOUNTER — Encounter: Payer: Self-pay | Admitting: Physician Assistant

## 2018-11-06 ENCOUNTER — Other Ambulatory Visit: Payer: Self-pay

## 2018-11-06 ENCOUNTER — Ambulatory Visit (INDEPENDENT_AMBULATORY_CARE_PROVIDER_SITE_OTHER): Payer: BC Managed Care – PPO | Admitting: Physician Assistant

## 2018-11-06 DIAGNOSIS — F902 Attention-deficit hyperactivity disorder, combined type: Secondary | ICD-10-CM

## 2018-11-06 DIAGNOSIS — F411 Generalized anxiety disorder: Secondary | ICD-10-CM

## 2018-11-06 MED ORDER — ALPRAZOLAM 0.5 MG PO TABS: ORAL_TABLET | ORAL | 2 refills | Status: DC

## 2018-11-06 NOTE — Progress Notes (Signed)
[facility]  Patient ID: Stephanie Palmer,  MRN: [id number]  PCP: Fanny Bien, MD  Date of Evaluation: 11/06/2018 Time spent:15 minutes  Chief Complaint:  Chief Complaint    Anxiety; Follow-up     Virtual Visit via Telephone Note  I connected with patient by a video enabled telemedicine application or telephone, with their informed consent, and verified patient privacy and that I am speaking with the correct person using two identifiers.  I am private, in my office and the patient is home.  I discussed the limitations, risks, security and privacy concerns of performing an evaluation and management service by telephone and the availability of in person appointments. I also discussed with the patient that there may be a patient responsible charge related to this service. The patient expressed understanding and agreed to proceed.   I discussed the assessment and treatment plan with the patient. The patient was provided an opportunity to ask questions and all were answered. The patient agreed with the plan and demonstrated an understanding of the instructions.   The patient was advised to call back or seek an in-person evaluation if the symptoms worsen or if the condition fails to improve as anticipated.  I provided 15 minutes of non-face-to-face time during this encounter.  HISTORY/CURRENT STATUS: HPI for routine med check.  At the last visit, we put the Adderall on hold and added Xanax.  She is only taking it at night because that is when she gets more anxious and has trouble sleeping if she does not take it.  It has been very helpful.  Due to the coronavirus and everything going on in the world, she has had more anxiety.  It does not usually occur during the day however.  There are times when she feels like the Xanax would be helpful during the day but she does not take it.  She does not want to get reliant on it.  Patient denies loss of interest in usual  activities and is able to enjoy things.  Denies decreased energy or motivation.  Appetite has not changed.  No extreme sadness, tearfulness, or feelings of hopelessness.  Denies any changes in concentration, making decisions or remembering things.  Denies suicidal or homicidal thoughts.  She and her husband have changed their diets.  She drinks celery juice every morning.  She has stopped all gluten.  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  That is also helped her tremendously.  Individual Medical History/ Review of Systems: Changes? :No    Past medications for mental health diagnoses include: unknown  Allergies: Patient has no known allergies.  Current Medications:  Current Outpatient Medications:  .  ALPRAZolam (XANAX) 0.5 MG tablet, TAKE 1/2 TO 1 TABLET(0.25 TO 0.5 MG) BY MOUTH TWICE DAILY AS NEEDED FOR ANXIETY, Disp: 30 tablet, Rfl: 2 .  Multiple Vitamins-Minerals (MULTIVITAMIN WITH MINERALS) tablet, Take 2 tablets by mouth daily. , Disp: , Rfl:  .  Omega-3 1000 MG CAPS, Take 2,000 mg by mouth daily. , Disp: , Rfl:  .  Probiotic Product (PROBIOTIC PO), Take 1 capsule by mouth daily. , Disp: , Rfl:  .  amphetamine-dextroamphetamine (ADDERALL) 10 MG tablet, Take 0.5-1 tablets (5-10 mg total) by mouth daily with breakfast. (Patient not taking: Reported on 09/09/2018), Disp: 30 tablet, Rfl: 0 .  amphetamine-dextroamphetamine (ADDERALL) 10 MG tablet, Take 1 tablet (10 mg total) by mouth daily with breakfast. (Patient not taking: Reported on 09/09/2018), Disp: 30  tablet, Rfl: 0 .  amphetamine-dextroamphetamine (ADDERALL) 10 MG tablet, Take 1 tablet (10 mg total) by mouth daily with breakfast. (Patient not taking: Reported on 09/09/2018), Disp: 30 tablet, Rfl: 0 .  amphetamine-dextroamphetamine (ADDERALL) 10 MG tablet, Take 1 tablet (10 mg total) by mouth daily with breakfast. (Patient not taking: Reported on 11/06/2018), Disp: 30 tablet, Rfl: 0 .  ibuprofen  (ADVIL,MOTRIN) 800 MG tablet, Take 1 tablet (800 mg total) by mouth every 8 (eight) hours. (Patient not taking: Reported on 09/09/2018), Disp: 30 tablet, Rfl: 0 .  loratadine (CLARITIN) 10 MG tablet, Take 10 mg by mouth daily., Disp: , Rfl:  Medication Side Effects: none  Family Medical/ Social History: Changes? No except Mom is moving here in the Fall.   MENTAL HEALTH EXAM:  Last menstrual period 05/08/2018.There is no height or weight on file to calculate BMI.  General Appearance: unable to assess  Eye Contact:  unable to assess  Speech:  Clear and Coherent  Volume:  Normal  Mood:  Euthymic  Affect:  unable to assess  Thought Process:  Goal Directed  Orientation:  Full (Time, Place, and Person)  Thought Content: Logical   Suicidal Thoughts:  No  Homicidal Thoughts:  No  Memory:  WNL  Judgement:  Good  Insight:  Good  Psychomotor Activity:  unable to assess  Concentration:  Concentration: Good  Recall:  Good  Fund of Knowledge: Good  Language: Good  Assets:  Desire for Improvement  ADL's:  Intact  Cognition: WNL  Prognosis:  Good    DIAGNOSES:    ICD-10-CM   1. Generalized anxiety disorder  F41.1   2. Attention deficit hyperactivity disorder (ADHD), combined type  F90.2     Receiving Psychotherapy: No    RECOMMENDATIONS:  Continue Xanax 0.5 mg, 1/2-1 daily as needed.  For now, we will not add an SSRI, SNRI, or BuSpar to help prevent anxiety.  I think it is mostly situational and hopefully once things calm down she will not need the Xanax.  She understands we may have to add on a preventative drug however. Continue to hold Adderall 10 mg unless she is off the benzo.  She understands. Return in 3 months.  Donnal Moat, PA-C   This record has been created using [employer].  Chart creation errors have been sought, but may not always have been located and corrected. Such creation errors do not reflect on the standard of medical care.

## 2018-12-05 ENCOUNTER — Other Ambulatory Visit: Payer: Self-pay

## 2018-12-05 DIAGNOSIS — Z20822 Contact with and (suspected) exposure to covid-19: Secondary | ICD-10-CM

## 2018-12-09 LAB — NOVEL CORONAVIRUS, NAA: SARS-CoV-2, NAA: NOT DETECTED

## 2018-12-19 DIAGNOSIS — G43909 Migraine, unspecified, not intractable, without status migrainosus: Secondary | ICD-10-CM | POA: Diagnosis not present

## 2018-12-19 DIAGNOSIS — G4700 Insomnia, unspecified: Secondary | ICD-10-CM | POA: Diagnosis not present

## 2018-12-19 DIAGNOSIS — G44209 Tension-type headache, unspecified, not intractable: Secondary | ICD-10-CM | POA: Diagnosis not present

## 2018-12-23 DIAGNOSIS — S96911S Strain of unspecified muscle and tendon at ankle and foot level, right foot, sequela: Secondary | ICD-10-CM | POA: Diagnosis not present

## 2018-12-24 ENCOUNTER — Other Ambulatory Visit: Payer: Self-pay | Admitting: Family Medicine

## 2018-12-24 ENCOUNTER — Ambulatory Visit
Admission: RE | Admit: 2018-12-24 | Discharge: 2018-12-24 | Disposition: A | Discharge status: Home / Self Care | Payer: BLUE CROSS/BLUE SHIELD | Source: Ambulatory Visit | Attending: Family Medicine | Admitting: Family Medicine

## 2018-12-24 DIAGNOSIS — M79671 Pain in right foot: Secondary | ICD-10-CM

## 2018-12-24 DIAGNOSIS — S99921A Unspecified injury of right foot, initial encounter: Secondary | ICD-10-CM

## 2019-01-16 DIAGNOSIS — G44009 Cluster headache syndrome, unspecified, not intractable: Secondary | ICD-10-CM | POA: Diagnosis not present

## 2019-01-16 DIAGNOSIS — G4700 Insomnia, unspecified: Secondary | ICD-10-CM | POA: Diagnosis not present

## 2019-01-16 DIAGNOSIS — R41840 Attention and concentration deficit: Secondary | ICD-10-CM | POA: Diagnosis not present

## 2019-01-30 DIAGNOSIS — E785 Hyperlipidemia, unspecified: Secondary | ICD-10-CM | POA: Diagnosis not present

## 2019-01-30 DIAGNOSIS — Z0000 Encounter for general adult medical examination without abnormal findings: Secondary | ICD-10-CM | POA: Diagnosis not present

## 2019-01-31 DIAGNOSIS — Z1159 Encounter for screening for other viral diseases: Secondary | ICD-10-CM | POA: Diagnosis not present

## 2019-02-05 DIAGNOSIS — Z6824 Body mass index (BMI) 24.0-24.9, adult: Secondary | ICD-10-CM | POA: Diagnosis not present

## 2019-02-05 DIAGNOSIS — Z0000 Encounter for general adult medical examination without abnormal findings: Secondary | ICD-10-CM | POA: Diagnosis not present

## 2019-02-05 DIAGNOSIS — Z23 Encounter for immunization: Secondary | ICD-10-CM | POA: Diagnosis not present

## 2019-02-14 ENCOUNTER — Telehealth: Payer: Self-pay | Admitting: Physician Assistant

## 2019-02-14 ENCOUNTER — Other Ambulatory Visit: Payer: Self-pay | Admitting: Physician Assistant

## 2019-02-14 MED ORDER — ALPRAZOLAM 0.5 MG PO TABS: ORAL_TABLET | ORAL | 2 refills | Status: DC

## 2019-02-14 NOTE — Telephone Encounter (Signed)
Pt needs refill on xanax that needs to be sent to [facility] on [address] and [address]. Pt made appt for Oct 29th

## 2019-02-14 NOTE — Telephone Encounter (Signed)
Rx was sent  

## 2019-03-11 ENCOUNTER — Other Ambulatory Visit: Payer: Self-pay | Admitting: Registered"

## 2019-03-11 DIAGNOSIS — Z20828 Contact with and (suspected) exposure to other viral communicable diseases: Secondary | ICD-10-CM | POA: Diagnosis not present

## 2019-03-11 DIAGNOSIS — Z20822 Contact with and (suspected) exposure to covid-19: Secondary | ICD-10-CM

## 2019-03-13 ENCOUNTER — Ambulatory Visit (INDEPENDENT_AMBULATORY_CARE_PROVIDER_SITE_OTHER): Payer: BC Managed Care – PPO | Admitting: Physician Assistant

## 2019-03-13 ENCOUNTER — Other Ambulatory Visit: Payer: Self-pay

## 2019-03-13 ENCOUNTER — Encounter: Payer: Self-pay | Admitting: Physician Assistant

## 2019-03-13 DIAGNOSIS — F411 Generalized anxiety disorder: Secondary | ICD-10-CM | POA: Diagnosis not present

## 2019-03-13 DIAGNOSIS — F902 Attention-deficit hyperactivity disorder, combined type: Secondary | ICD-10-CM

## 2019-03-13 DIAGNOSIS — F4311 Post-traumatic stress disorder, acute: Secondary | ICD-10-CM

## 2019-03-13 LAB — NOVEL CORONAVIRUS, NAA: SARS-CoV-2, NAA: NOT DETECTED

## 2019-03-13 MED ORDER — ALPRAZOLAM 0.5 MG PO TABS: ORAL_TABLET | ORAL | 2 refills | Status: DC

## 2019-03-13 MED ORDER — SERTRALINE HCL 100 MG PO TABS: ORAL_TABLET | ORAL | 1 refills | Status: DC
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
## 2019-05-30 ENCOUNTER — Other Ambulatory Visit: Payer: BC Managed Care – PPO

## 2019-06-02 ENCOUNTER — Ambulatory Visit: Payer: BC Managed Care – PPO | Attending: Internal Medicine

## 2019-06-02 DIAGNOSIS — Z20822 Contact with and (suspected) exposure to covid-19: Secondary | ICD-10-CM

## 2019-06-03 LAB — NOVEL CORONAVIRUS, NAA: SARS-CoV-2, NAA: NOT DETECTED

## 2019-06-13 ENCOUNTER — Ambulatory Visit: Payer: BC Managed Care – PPO | Attending: Internal Medicine

## 2019-06-13 DIAGNOSIS — Z20822 Contact with and (suspected) exposure to covid-19: Secondary | ICD-10-CM

## 2019-06-14 LAB — NOVEL CORONAVIRUS, NAA: SARS-CoV-2, NAA: NOT DETECTED

## 2019-06-17 ENCOUNTER — Other Ambulatory Visit: Payer: Self-pay | Admitting: Physician Assistant

## 2019-06-17 NOTE — Telephone Encounter (Signed)
Last apt 10/29, was due back 4 weeks. Sent message to set up apt

## 2019-07-15 ENCOUNTER — Ambulatory Visit: Payer: BC Managed Care – PPO | Admitting: Physician Assistant

## 2019-07-18 ENCOUNTER — Other Ambulatory Visit: Payer: Self-pay | Admitting: Physician Assistant

## 2019-07-18 NOTE — Telephone Encounter (Signed)
Last apt 02/2019, was due back 4 weeks nothing scheduled

## 2019-07-21 ENCOUNTER — Telehealth: Payer: Self-pay | Admitting: Physician Assistant

## 2019-07-21 ENCOUNTER — Other Ambulatory Visit: Payer: Self-pay | Admitting: Physician Assistant

## 2019-07-21 ENCOUNTER — Ambulatory Visit: Payer: BC Managed Care – PPO | Attending: Internal Medicine

## 2019-07-21 DIAGNOSIS — Z23 Encounter for immunization: Secondary | ICD-10-CM | POA: Insufficient documentation

## 2019-07-21 MED ORDER — ALPRAZOLAM 0.5 MG PO TABS: 0.5000 mg | ORAL_TABLET | Freq: Two times a day (BID) | ORAL | 0 refills | Status: DC | PRN

## 2019-07-21 NOTE — Progress Notes (Signed)
   Covid-19 Vaccination Clinic  Name:  Stephanie Palmer    MRN: [id number] DOB: 07-Apr-1971  07/21/2019  [name] was observed post Covid-19 immunization for 15 minutes without incident. She was provided with Vaccine Information Sheet and instruction to access the V-Safe system.   [name] was instructed to call 911 with any severe reactions post vaccine: Marland Kitchen Difficulty breathing  . Swelling of face and throat  . A fast heartbeat  . A bad rash all over body  . Dizziness and weakness   Immunizations Administered    Name Date Dose VIS Date Route   Pfizer COVID-19 Vaccine 07/21/2019  4:22 PM 0.3 mL 04/25/2019 Intramuscular   Manufacturer: [place]   Lot: [id number]   [place]: [id number]

## 2019-07-21 NOTE — Telephone Encounter (Signed)
Rx for Xanax sent in.

## 2019-07-21 NOTE — Telephone Encounter (Signed)
Stephanie Palmer called to make appt so she could get refill of her Xanax.  Pharmacy told her we rejected her refill because she needed an appt.  She is scheduled for 08/01/19.  She asks to at least get enough to get her to appt. [facility]

## 2019-08-01 ENCOUNTER — Other Ambulatory Visit: Payer: Self-pay

## 2019-08-01 ENCOUNTER — Ambulatory Visit (INDEPENDENT_AMBULATORY_CARE_PROVIDER_SITE_OTHER): Payer: BC Managed Care – PPO | Admitting: Physician Assistant

## 2019-08-01 ENCOUNTER — Encounter: Payer: Self-pay | Admitting: Physician Assistant

## 2019-08-01 DIAGNOSIS — F411 Generalized anxiety disorder: Secondary | ICD-10-CM

## 2019-08-01 DIAGNOSIS — F900 Attention-deficit hyperactivity disorder, predominantly inattentive type: Secondary | ICD-10-CM

## 2019-08-01 DIAGNOSIS — G4700 Insomnia, unspecified: Secondary | ICD-10-CM | POA: Diagnosis not present

## 2019-08-01 MED ORDER — BUSPIRONE HCL 15 MG PO TABS: ORAL_TABLET | ORAL | 1 refills | Status: DC

## 2019-08-01 MED ORDER — B COMPLEX PLUS PO TABS: 1.0000 | ORAL_TABLET | Freq: Every day | ORAL | 11 refills | Status: AC

## 2019-08-01 MED ORDER — VITAMIN D 50 MCG (2000 UT) PO CAPS: 1.0000 | ORAL_CAPSULE | Freq: Every day | ORAL | 11 refills | Status: AC
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
## 2019-08-11 ENCOUNTER — Ambulatory Visit: Payer: BC Managed Care – PPO | Attending: Internal Medicine

## 2019-08-11 DIAGNOSIS — Z23 Encounter for immunization: Secondary | ICD-10-CM

## 2019-08-11 NOTE — Progress Notes (Signed)
   Covid-19 Vaccination Clinic  Name:  Stephanie Palmer    MRN: [id number] DOB: [birth year]  08/11/2019  [name] was observed post Covid-19 immunization for 15 minutes without incident. She was provided with Vaccine Information Sheet and instruction to access the V-Safe system.   [name] was instructed to call 911 with any severe reactions post vaccine: Marland Kitchen Difficulty breathing  . Swelling of face and throat  . A fast heartbeat  . A bad rash all over body  . Dizziness and weakness   Immunizations Administered    Name Date Dose VIS Date Route   Pfizer COVID-19 Vaccine 08/11/2019  8:36 AM 0.3 mL 04/25/2019 Intramuscular   Manufacturer: [place]   Lot: [id number]   [place]: [id number]

## 2019-08-19 ENCOUNTER — Other Ambulatory Visit: Payer: Self-pay | Admitting: Physician Assistant

## 2019-08-20 NOTE — Telephone Encounter (Signed)
Next apt 08/29/2019

## 2019-08-29 ENCOUNTER — Ambulatory Visit (INDEPENDENT_AMBULATORY_CARE_PROVIDER_SITE_OTHER): Payer: BC Managed Care – PPO | Admitting: Physician Assistant

## 2019-08-29 ENCOUNTER — Other Ambulatory Visit: Payer: Self-pay

## 2019-08-29 ENCOUNTER — Encounter: Payer: Self-pay | Admitting: Physician Assistant

## 2019-08-29 DIAGNOSIS — G4700 Insomnia, unspecified: Secondary | ICD-10-CM

## 2019-08-29 DIAGNOSIS — F411 Generalized anxiety disorder: Secondary | ICD-10-CM | POA: Diagnosis not present

## 2019-08-29 NOTE — Progress Notes (Signed)
[facility]  Patient ID: Stephanie Palmer,  MRN: [id number]  PCP: Fanny Bien, MD  Date of Evaluation: 08/29/2019 Time spent:20 minutes  Chief Complaint:  Chief Complaint    Anxiety      HISTORY/CURRENT STATUS: HPI  For routine med check.  Couldn't tolerate the Buspar. It caused headache and nausea, and since she was having to go back and forth to [place] to help her parents out, she stopped it.  "I could not help them out and feel sick at the same time.  I will try it again later if I need to."  Overall she is feeling much better though and would like to try to get off the Xanax if possible.  She feels better since the weather has improved and she can do things outside.  She is able to enjoy that.  Energy and motivation are good.  No isolating.  Does not cry easily.  No suicidal or homicidal thoughts.  She still needs the Xanax to sleep.  She is tried without it and just cannot go to sleep.  Denies dizziness, syncope, seizures, numbness, tingling, tremor, tics, unsteady gait, slurred speech, confusion. Denies muscle or joint pain, stiffness, or dystonia.  Individual Medical History/ Review of Systems: Changes? :No    Past medications for mental health diagnoses include: Wellbutrin, Adderall, Xanax, Zoloft caused nausea  Allergies: Patient has no known allergies.  Current Medications:  Current Outpatient Medications:  .  ALPRAZolam (XANAX) 0.5 MG tablet, TAKE 1 TABLET(0.5 MG) BY MOUTH TWICE DAILY AS NEEDED FOR ANXIETY, Disp: 60 tablet, Rfl: 0 .  B Complex-Folic Acid (B COMPLEX PLUS) TABS, Take 1 tablet by mouth daily., Disp: 30 tablet, Rfl: 11 .  Cholecalciferol (VITAMIN D) 50 MCG (2000 UT) CAPS, Take 1 capsule (2,000 Units total) by mouth daily., Disp: 30 capsule, Rfl: 11 .  loratadine (CLARITIN) 10 MG tablet, Take 10 mg by mouth daily., Disp: , Rfl:  .  Multiple Vitamins-Minerals (MULTIVITAMIN WITH MINERALS) tablet, Take 2 tablets by mouth daily. ,  Disp: , Rfl:  .  Omega-3 1000 MG CAPS, Take 2,000 mg by mouth daily. , Disp: , Rfl:  .  Probiotic Product (PROBIOTIC PO), Take 1 capsule by mouth daily. , Disp: , Rfl:  .  amphetamine-dextroamphetamine (ADDERALL) 10 MG tablet, Take 0.5-1 tablets (5-10 mg total) by mouth daily with breakfast. (Patient not taking: Reported on 09/09/2018), Disp: 30 tablet, Rfl: 0 .  amphetamine-dextroamphetamine (ADDERALL) 10 MG tablet, Take 1 tablet (10 mg total) by mouth daily with breakfast. (Patient not taking: Reported on 09/09/2018), Disp: 30 tablet, Rfl: 0 .  amphetamine-dextroamphetamine (ADDERALL) 10 MG tablet, Take 1 tablet (10 mg total) by mouth daily with breakfast. (Patient not taking: Reported on 09/09/2018), Disp: 30 tablet, Rfl: 0 .  amphetamine-dextroamphetamine (ADDERALL) 10 MG tablet, Take 1 tablet (10 mg total) by mouth daily with breakfast. (Patient not taking: Reported on 11/06/2018), Disp: 30 tablet, Rfl: 0 .  busPIRone (BUSPAR) 15 MG tablet, 1/3 po bid for a week, then 2/3 po bid for 1 week, then 1 po bid. (Patient not taking: Reported on 08/29/2019), Disp: 60 tablet, Rfl: 1 .  ibuprofen (ADVIL,MOTRIN) 800 MG tablet, Take 1 tablet (800 mg total) by mouth every 8 (eight) hours. (Patient not taking: Reported on 09/09/2018), Disp: 30 tablet, Rfl: 0 Medication Side Effects: none  Family Medical/ Social History: Changes? No  MENTAL HEALTH EXAM:  Last menstrual period 05/08/2018.There is no height or weight on file to calculate BMI.  General Appearance: Casual, Neat and Well Groomed  Eye Contact:  Good  Speech:  Clear and Coherent and Normal Rate  Volume:  Normal  Mood:  Euthymic  Affect:  Appropriate  Thought Process:  Goal Directed and Descriptions of Associations: Intact  Orientation:  Full (Time, Place, and Person)  Thought Content: Logical   Suicidal Thoughts:  No  Homicidal Thoughts:  No  Memory:  WNL  Judgement:  Good  Insight:  Good  Psychomotor Activity:  Normal  Concentration:   Concentration: Good  Recall:  Good  Fund of Knowledge: Good  Language: Good  Assets:  Desire for Improvement  ADL's:  Intact  Cognition: WNL  Prognosis:  Good    DIAGNOSES:    ICD-10-CM   1. Insomnia, unspecified type  G47.00   2. Generalized anxiety disorder  F41.1     Receiving Psychotherapy: No    RECOMMENDATIONS:  PDMP reviewed. I spent 20 minutes with her. Discussed sleep hygiene.  As far as getting off the Xanax, I recommend that she take 1/2 pill during the day as needed and 1 p.o. nightly for several weeks and then she can decrease the night dose to 1/2 pill also.  She can stop at any time.  I did tell her that sometimes patients have to stay on Xanax just at night only or if needed we may add something else for sleep. Continue Xanax 0.5 mg, 1/2-1 twice daily as needed.  See above. Continue vitamins as listed on the med sheet. Return in 6 weeks. Donnal Moat, PA-C

## 2019-09-29 ENCOUNTER — Other Ambulatory Visit: Payer: Self-pay | Admitting: Physician Assistant

## 2019-09-29 NOTE — Telephone Encounter (Signed)
Has apt 05/28

## 2019-10-01 DIAGNOSIS — M9902 Segmental and somatic dysfunction of thoracic region: Secondary | ICD-10-CM | POA: Diagnosis not present

## 2019-10-01 DIAGNOSIS — M9905 Segmental and somatic dysfunction of pelvic region: Secondary | ICD-10-CM | POA: Diagnosis not present

## 2019-10-01 DIAGNOSIS — M62830 Muscle spasm of back: Secondary | ICD-10-CM | POA: Diagnosis not present

## 2019-10-01 DIAGNOSIS — M9903 Segmental and somatic dysfunction of lumbar region: Secondary | ICD-10-CM | POA: Diagnosis not present

## 2019-10-06 DIAGNOSIS — M9902 Segmental and somatic dysfunction of thoracic region: Secondary | ICD-10-CM | POA: Diagnosis not present

## 2019-10-06 DIAGNOSIS — M62830 Muscle spasm of back: Secondary | ICD-10-CM | POA: Diagnosis not present

## 2019-10-06 DIAGNOSIS — M9905 Segmental and somatic dysfunction of pelvic region: Secondary | ICD-10-CM | POA: Diagnosis not present

## 2019-10-06 DIAGNOSIS — M9903 Segmental and somatic dysfunction of lumbar region: Secondary | ICD-10-CM | POA: Diagnosis not present

## 2019-10-10 ENCOUNTER — Other Ambulatory Visit: Payer: Self-pay

## 2019-10-10 ENCOUNTER — Ambulatory Visit (INDEPENDENT_AMBULATORY_CARE_PROVIDER_SITE_OTHER): Payer: BC Managed Care – PPO | Admitting: Physician Assistant

## 2019-10-10 ENCOUNTER — Encounter: Payer: Self-pay | Admitting: Physician Assistant

## 2019-10-10 DIAGNOSIS — F411 Generalized anxiety disorder: Secondary | ICD-10-CM | POA: Diagnosis not present

## 2019-10-10 DIAGNOSIS — F902 Attention-deficit hyperactivity disorder, combined type: Secondary | ICD-10-CM | POA: Diagnosis not present

## 2019-10-10 DIAGNOSIS — G4700 Insomnia, unspecified: Secondary | ICD-10-CM | POA: Diagnosis not present

## 2019-10-10 MED ORDER — ALPRAZOLAM 0.5 MG PO TABS: ORAL_TABLET | ORAL | 3 refills | Status: DC

## 2019-10-10 NOTE — Progress Notes (Signed)
[facility]  Patient ID: Stephanie Palmer,  MRN: [id number]  PCP: Fanny Bien, MD  Date of Evaluation: 10/10/2019 Time spent:20 minutes  Chief Complaint:  Chief Complaint    Anxiety; Insomnia      HISTORY/CURRENT STATUS: HPI  For routine med check.  She has tried to wean down on the Xanax but has been unable to.  She mostly takes it at night to help her sleep.  She has no trouble falling asleep but staying asleep is the issue.  If she gets up and has to go to the restroom, she is unable to go straight back to sleep.  With using the Xanax, she is able to sleep through the whole night the vast majority of the time.  States she is not anxious like she used to be.  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  No panic attacks and when she does have anxiety it is triggered by something and to be expected.  Denies anhedonia, energy and motivation levels are good.  Not isolating.  No easy crying.  Hygiene is normal.  For the most part, she is able to focus on things and finish tasks without too many distractions.  Denies suicidal or homicidal thoughts.  Patient denies increased energy with decreased need for sleep, no increased talkativeness, no racing thoughts, no impulsivity or risky behaviors, no increased spending, no increased libido, no grandiosity, no increased irritability or anger, and no hallucinations.  Denies dizziness, syncope, seizures, numbness, tingling, tremor, tics, unsteady gait, slurred speech, confusion. Denies muscle or joint pain, stiffness, or dystonia.  Individual Medical History/ Review of Systems: Changes? :No    Past medications for mental health diagnoses include: Wellbutrin, Adderall, Xanax, Zoloft caused nausea  Allergies: Patient has no known allergies.  Current Medications:  Current Outpatient Medications:  .  ALPRAZolam (XANAX)  0.5 MG tablet, TAKE 1 TABLET(0.5 MG) BY MOUTH TWICE DAILY AS NEEDED FOR ANXIETY, Disp: 60 tablet, Rfl: 3 .  B Complex-Folic Acid (B COMPLEX PLUS) TABS, Take 1 tablet by mouth daily., Disp: 30 tablet, Rfl: 11 .  Cholecalciferol (VITAMIN D) 50 MCG (2000 UT) CAPS, Take 1 capsule (2,000 Units total) by mouth daily., Disp: 30 capsule, Rfl: 11 .  Multiple Vitamins-Minerals (MULTIVITAMIN WITH MINERALS) tablet, Take 2 tablets by mouth daily. , Disp: , Rfl:  .  Omega-3 1000 MG CAPS, Take 2,000 mg by mouth daily. , Disp: , Rfl:  .  Probiotic Product (PROBIOTIC PO), Take 1 capsule by mouth daily. , Disp: , Rfl:  .  amphetamine-dextroamphetamine (ADDERALL) 10 MG tablet, Take 0.5-1 tablets (5-10 mg total) by mouth daily with breakfast. (Patient not taking: Reported on 10/10/2019), Disp: 30 tablet, Rfl: 0 .  amphetamine-dextroamphetamine (ADDERALL) 10 MG tablet, Take 1 tablet (10 mg total) by mouth daily with breakfast. (Patient not taking: Reported on 09/09/2018), Disp: 30 tablet, Rfl: 0 .  amphetamine-dextroamphetamine (ADDERALL) 10 MG tablet, Take 1 tablet (10 mg total) by mouth daily with breakfast. (Patient not taking: Reported on 09/09/2018), Disp: 30 tablet, Rfl: 0 .  amphetamine-dextroamphetamine (ADDERALL) 10 MG tablet, Take 1 tablet (10 mg total) by mouth daily with breakfast. (Patient not taking: Reported on 11/06/2018), Disp: 30 tablet, Rfl: 0 .  ibuprofen (ADVIL,MOTRIN) 800 MG tablet, Take 1 tablet (800 mg total) by mouth every 8 (eight) hours. (Patient not taking: Reported on 09/09/2018), Disp:  30 tablet, Rfl: 0 .  loratadine (CLARITIN) 10 MG tablet, Take 10 mg by mouth daily., Disp: , Rfl:  Medication Side Effects: none  Family Medical/ Social History: Changes? No  MENTAL HEALTH EXAM:  Last menstrual period 05/08/2018.There is no height or weight on file to calculate BMI.  General Appearance: Casual, Neat and Well Groomed  Eye Contact:  Good  Speech:  Clear and Coherent and Normal Rate  Volume:   Normal  Mood:  Euthymic  Affect:  Appropriate  Thought Process:  Goal Directed and Descriptions of Associations: Intact  Orientation:  Full (Time, Place, and Person)  Thought Content: Logical   Suicidal Thoughts:  No  Homicidal Thoughts:  No  Memory:  WNL  Judgement:  Good  Insight:  Good  Psychomotor Activity:  Normal  Concentration:  Concentration: Good  Recall:  Good  Fund of Knowledge: Good  Language: Good  Assets:  Desire for Improvement  ADL's:  Intact  Cognition: WNL  Prognosis:  Good    DIAGNOSES:    ICD-10-CM   1. Insomnia, unspecified type  G47.00   2. Generalized anxiety disorder  F41.1   3. Attention deficit hyperactivity disorder (ADHD), combined type  F90.2     Receiving Psychotherapy: No    RECOMMENDATIONS:  PDMP reviewed. I spent 20 minutes with her. She is aware that we typically do not prescribe benzos alone.  However in her case I think this is warranted because she is taking the Xanax usually only for sleep.  If things change, and she is having more anxiety or depression for sure, then an SSRI, or SNRI will be added. Discussed sleep hygiene.   Continue Xanax 0.5 mg, 1 p.o. twice daily as needed. Continue vitamins as listed on the med sheet. Return in 4 months.   Donnal Moat, PA-C

## 2019-10-14 ENCOUNTER — Ambulatory Visit: Payer: Self-pay | Admitting: Allergy

## 2019-10-23 DIAGNOSIS — N3000 Acute cystitis without hematuria: Secondary | ICD-10-CM | POA: Diagnosis not present

## 2019-10-31 ENCOUNTER — Other Ambulatory Visit: Payer: Self-pay | Admitting: Physician Assistant

## 2019-10-31 NOTE — Telephone Encounter (Signed)
Next apt 02/03/2020

## 2019-11-18 DIAGNOSIS — K219 Gastro-esophageal reflux disease without esophagitis: Secondary | ICD-10-CM | POA: Diagnosis not present

## 2019-11-18 DIAGNOSIS — R109 Unspecified abdominal pain: Secondary | ICD-10-CM | POA: Diagnosis not present

## 2019-11-18 DIAGNOSIS — N3000 Acute cystitis without hematuria: Secondary | ICD-10-CM | POA: Diagnosis not present

## 2019-11-19 ENCOUNTER — Other Ambulatory Visit: Payer: Self-pay | Admitting: Family Medicine

## 2019-11-19 DIAGNOSIS — R109 Unspecified abdominal pain: Secondary | ICD-10-CM

## 2019-11-27 ENCOUNTER — Other Ambulatory Visit: Payer: BC Managed Care – PPO

## 2019-11-30 ENCOUNTER — Other Ambulatory Visit: Payer: Self-pay | Admitting: Physician Assistant

## 2019-12-03 ENCOUNTER — Ambulatory Visit
Admission: RE | Admit: 2019-12-03 | Discharge: 2019-12-03 | Disposition: A | Discharge status: Home / Self Care | Payer: BC Managed Care – PPO | Source: Ambulatory Visit | Attending: Family Medicine | Admitting: Family Medicine

## 2019-12-03 DIAGNOSIS — K7689 Other specified diseases of liver: Secondary | ICD-10-CM | POA: Diagnosis not present

## 2019-12-03 DIAGNOSIS — R109 Unspecified abdominal pain: Secondary | ICD-10-CM

## 2019-12-03 DIAGNOSIS — Q600 Renal agenesis, unilateral: Secondary | ICD-10-CM | POA: Diagnosis not present

## 2019-12-04 DIAGNOSIS — M545 Low back pain: Secondary | ICD-10-CM | POA: Diagnosis not present

## 2019-12-04 DIAGNOSIS — K219 Gastro-esophageal reflux disease without esophagitis: Secondary | ICD-10-CM | POA: Diagnosis not present

## 2019-12-04 DIAGNOSIS — K7689 Other specified diseases of liver: Secondary | ICD-10-CM | POA: Diagnosis not present

## 2019-12-08 DIAGNOSIS — F411 Generalized anxiety disorder: Secondary | ICD-10-CM | POA: Diagnosis not present

## 2020-01-01 ENCOUNTER — Telehealth: Payer: Self-pay | Admitting: Physician Assistant

## 2020-01-01 ENCOUNTER — Other Ambulatory Visit: Payer: Self-pay | Admitting: Physician Assistant

## 2020-01-01 MED ORDER — ALPRAZOLAM 0.5 MG PO TABS: 0.5000 mg | ORAL_TABLET | Freq: Two times a day (BID) | ORAL | 0 refills | Status: DC | PRN

## 2020-01-01 NOTE — Telephone Encounter (Signed)
PDMP was reviewed and there are no concerns.  Prescription was sent.

## 2020-01-01 NOTE — Telephone Encounter (Signed)
Pt has an appt with Helene Kelp on 01/29/20. She needs a refill on her Xanax but currently in [place] and needs it filled this one time at [facility], [address] [place], [place] and phone # is [phone]

## 2020-01-08 DIAGNOSIS — M545 Low back pain: Secondary | ICD-10-CM | POA: Diagnosis not present

## 2020-01-08 DIAGNOSIS — R5383 Other fatigue: Secondary | ICD-10-CM | POA: Diagnosis not present

## 2020-01-08 DIAGNOSIS — K219 Gastro-esophageal reflux disease without esophagitis: Secondary | ICD-10-CM | POA: Diagnosis not present

## 2020-01-25 ENCOUNTER — Ambulatory Visit: Payer: BC Managed Care – PPO | Attending: Internal Medicine

## 2020-01-25 DIAGNOSIS — Z23 Encounter for immunization: Secondary | ICD-10-CM

## 2020-01-25 NOTE — Progress Notes (Signed)
   [facility]ov[facility] Vaccination [facility]  Name:  Elener Custodio    MRN: [id number] DOB: 09-18-1970  01/25/2020  Ms. Kluver was observed post Covid-19 immunization for 15 minutes without incident. She was provided with Vaccine Information Sheet and instruction to access the V-Safe system.   Ms. Odekirk was instructed to call 911 with any severe reactions post vaccine: Marland Kitchen Difficulty breathing  . Swelling of face and throat  . A fast heartbeat  . A bad rash all over body  . Dizziness and weakness

## 2020-01-29 ENCOUNTER — Ambulatory Visit (INDEPENDENT_AMBULATORY_CARE_PROVIDER_SITE_OTHER): Payer: BC Managed Care – PPO | Admitting: Physician Assistant

## 2020-01-29 ENCOUNTER — Encounter: Payer: Self-pay | Admitting: Physician Assistant

## 2020-01-29 ENCOUNTER — Other Ambulatory Visit: Payer: Self-pay

## 2020-01-29 DIAGNOSIS — F411 Generalized anxiety disorder: Secondary | ICD-10-CM

## 2020-01-29 DIAGNOSIS — G4700 Insomnia, unspecified: Secondary | ICD-10-CM | POA: Diagnosis not present

## 2020-01-29 MED ORDER — ALPRAZOLAM 0.5 MG PO TABS: 0.5000 mg | ORAL_TABLET | Freq: Two times a day (BID) | ORAL | 5 refills | Status: DC | PRN

## 2020-01-29 NOTE — Progress Notes (Signed)
[facility]  Patient ID: Stephanie Palmer,  MRN: [id number]  PCP: Fanny Bien, MD  Date of Evaluation: 01/29/2020 Time spent:20 minutes  Chief Complaint:  Chief Complaint    Follow-up      HISTORY/CURRENT STATUS: For routine med check.  She is doing really well for the most part mostly takes the Xanax to help her sleep.  She does need it during the day as well at times.  She was in [place] a month or so ago moving her mom here and she ran out of the Xanax for a few days.  She really did not sleep much at all.  When she does take it, she can go to sleep and stay asleep and feels rested the next morning.  Anxiety is well controlled and not usually having panic attacks.  When she does feel anxious, it is triggered by something and the Xanax definitely helps.  Denies anhedonia, energy and motivation levels are good.  Not isolating.  No easy crying.  Hygiene is normal.  For the most part, she is able to focus on things and finish tasks without too many distractions.  Denies suicidal or homicidal thoughts.  Patient denies increased energy with decreased need for sleep, no increased talkativeness, no racing thoughts, no impulsivity or risky behaviors, no increased spending, no increased libido, no grandiosity, no increased irritability or anger, and no hallucinations.  Denies dizziness, syncope, seizures, numbness, tingling, tremor, tics, unsteady gait, slurred speech, confusion. Denies muscle or joint pain, stiffness, or dystonia.  Individual Medical History/ Review of Systems: Changes? :No    Past medications for mental health diagnoses include: Wellbutrin, Adderall, Xanax, Zoloft caused nausea  Allergies: Patient has no known allergies.  Current Medications:  Current Outpatient Medications:    ALPRAZolam (XANAX) 0.5 MG tablet, Take 1 tablet (0.5 mg total) by mouth 2 (two) times daily as needed for anxiety., Disp: 60 tablet, Rfl: 5   B Complex-Folic Acid (B  COMPLEX PLUS) TABS, Take 1 tablet by mouth daily., Disp: 30 tablet, Rfl: 11   Cholecalciferol (VITAMIN D) 50 MCG (2000 UT) CAPS, Take 1 capsule (2,000 Units total) by mouth daily., Disp: 30 capsule, Rfl: 11   loratadine (CLARITIN) 10 MG tablet, Take 10 mg by mouth daily., Disp: , Rfl:    Multiple Vitamins-Minerals (MULTIVITAMIN WITH MINERALS) tablet, Take 2 tablets by mouth daily. , Disp: , Rfl:    Omega-3 1000 MG CAPS, Take 2,000 mg by mouth daily. , Disp: , Rfl:    Probiotic Product (PROBIOTIC PO), Take 1 capsule by mouth daily. , Disp: , Rfl:    amphetamine-dextroamphetamine (ADDERALL) 10 MG tablet, Take 0.5-1 tablets (5-10 mg total) by mouth daily with breakfast. (Patient not taking: Reported on 10/10/2019), Disp: 30 tablet, Rfl: 0   amphetamine-dextroamphetamine (ADDERALL) 10 MG tablet, Take 1 tablet (10 mg total) by mouth daily with breakfast. (Patient not taking: Reported on 09/09/2018), Disp: 30 tablet, Rfl: 0   amphetamine-dextroamphetamine (ADDERALL) 10 MG tablet, Take 1 tablet (10 mg total) by mouth daily with breakfast. (Patient not taking: Reported on 09/09/2018), Disp: 30 tablet, Rfl: 0   amphetamine-dextroamphetamine (ADDERALL) 10 MG tablet, Take 1 tablet (10 mg total) by mouth daily with breakfast. (Patient not taking: Reported on 11/06/2018), Disp: 30 tablet, Rfl: 0   ibuprofen (ADVIL,MOTRIN) 800 MG tablet, Take 1 tablet (800 mg total) by mouth every 8 (eight) hours. (Patient not taking: Reported on 09/09/2018), Disp: 30 tablet, Rfl: 0 Medication Side Effects: none  Family Medical/ Social  History: Changes? Her Mom moved in with her and her family  [place]:  Last menstrual period 05/08/2018.There is no height or weight on file to calculate BMI.  General Appearance: Casual, Neat and Well Groomed  Eye Contact:  Good  Speech:  Clear and Coherent and Normal Rate  Volume:  Normal  Mood:  Euthymic  Affect:  Appropriate  Thought Process:  Goal Directed and  Descriptions of Associations: Intact  Orientation:  Full (Time, Place, and Person)  Thought Content: Logical   Suicidal Thoughts:  No  Homicidal Thoughts:  No  Memory:  WNL  Judgement:  Good  Insight:  Good  Psychomotor Activity:  Normal  Concentration:  Concentration: Good  Recall:  Good  Fund of Knowledge: Good  Language: Good  Assets:  Desire for Improvement  ADL's:  Intact  Cognition: WNL  Prognosis:  Good    DIAGNOSES:    ICD-10-CM   1. Insomnia, unspecified type  G47.00   2. Generalized anxiety disorder  F41.1     Receiving Psychotherapy: No    RECOMMENDATIONS:  PDMP reviewed. I provided 20 minutes of face-to-face time during this encounter. She is aware that we typically do not prescribe benzos alone.  However in her case I think this is warranted because she is taking the Xanax usually only for sleep.  If things change, and she is having more anxiety or depression for sure, then an SSRI, or SNRI will be added. Discussed sleep hygiene.   Continue Xanax 0.5 mg, 1 p.o. twice daily as needed. Continue vitamins as listed on the med sheet. Return in 6 months.   Donnal Moat, PA-C

## 2020-02-03 ENCOUNTER — Ambulatory Visit: Payer: BC Managed Care – PPO | Admitting: Physician Assistant

## 2020-02-03 DIAGNOSIS — N3000 Acute cystitis without hematuria: Secondary | ICD-10-CM | POA: Diagnosis not present

## 2020-02-05 DIAGNOSIS — F411 Generalized anxiety disorder: Secondary | ICD-10-CM | POA: Diagnosis not present

## 2020-02-06 DIAGNOSIS — Z0000 Encounter for general adult medical examination without abnormal findings: Secondary | ICD-10-CM | POA: Diagnosis not present

## 2020-02-06 DIAGNOSIS — Z1159 Encounter for screening for other viral diseases: Secondary | ICD-10-CM | POA: Diagnosis not present

## 2020-02-06 DIAGNOSIS — Z20828 Contact with and (suspected) exposure to other viral communicable diseases: Secondary | ICD-10-CM | POA: Diagnosis not present

## 2020-02-11 DIAGNOSIS — K219 Gastro-esophageal reflux disease without esophagitis: Secondary | ICD-10-CM | POA: Diagnosis not present

## 2020-02-11 DIAGNOSIS — Z0000 Encounter for general adult medical examination without abnormal findings: Secondary | ICD-10-CM | POA: Diagnosis not present

## 2020-02-11 DIAGNOSIS — Z1211 Encounter for screening for malignant neoplasm of colon: Secondary | ICD-10-CM | POA: Diagnosis not present

## 2020-02-11 DIAGNOSIS — G4700 Insomnia, unspecified: Secondary | ICD-10-CM | POA: Diagnosis not present

## 2020-02-11 DIAGNOSIS — F411 Generalized anxiety disorder: Secondary | ICD-10-CM | POA: Diagnosis not present

## 2020-02-12 DIAGNOSIS — F411 Generalized anxiety disorder: Secondary | ICD-10-CM | POA: Diagnosis not present

## 2020-03-02 DIAGNOSIS — Z23 Encounter for immunization: Secondary | ICD-10-CM | POA: Diagnosis not present

## 2020-03-12 DIAGNOSIS — N3000 Acute cystitis without hematuria: Secondary | ICD-10-CM | POA: Diagnosis not present

## 2020-03-12 DIAGNOSIS — N390 Urinary tract infection, site not specified: Secondary | ICD-10-CM | POA: Diagnosis not present

## 2020-03-16 ENCOUNTER — Other Ambulatory Visit: Payer: Self-pay

## 2020-03-16 ENCOUNTER — Encounter (HOSPITAL_COMMUNITY): Payer: Self-pay

## 2020-03-16 ENCOUNTER — Emergency Department (HOSPITAL_COMMUNITY): Payer: BC Managed Care – PPO

## 2020-03-16 ENCOUNTER — Emergency Department (HOSPITAL_COMMUNITY)
Admission: EM | Admit: 2020-03-16 | Discharge: 2020-03-16 | Disposition: A | Discharge status: Home / Self Care | Payer: BC Managed Care – PPO | Attending: Emergency Medicine | Admitting: Emergency Medicine

## 2020-03-16 DIAGNOSIS — N12 Tubulo-interstitial nephritis, not specified as acute or chronic: Secondary | ICD-10-CM | POA: Insufficient documentation

## 2020-03-16 DIAGNOSIS — R1031 Right lower quadrant pain: Secondary | ICD-10-CM | POA: Diagnosis not present

## 2020-03-16 DIAGNOSIS — K5900 Constipation, unspecified: Secondary | ICD-10-CM | POA: Diagnosis not present

## 2020-03-16 DIAGNOSIS — R509 Fever, unspecified: Secondary | ICD-10-CM

## 2020-03-16 DIAGNOSIS — R109 Unspecified abdominal pain: Secondary | ICD-10-CM

## 2020-03-16 DIAGNOSIS — Z20822 Contact with and (suspected) exposure to covid-19: Secondary | ICD-10-CM | POA: Insufficient documentation

## 2020-03-16 LAB — RESPIRATORY PANEL BY RT PCR (FLU A&B, COVID)
Influenza A by PCR: NEGATIVE
Influenza B by PCR: NEGATIVE
SARS Coronavirus 2 by RT PCR: NEGATIVE

## 2020-03-16 LAB — CBC
HCT: 40.8 % (ref 36.0–46.0)
Hemoglobin: 12.7 g/dL (ref 12.0–15.0)
MCH: 27.5 pg (ref 26.0–34.0)
MCHC: 31.1 g/dL (ref 30.0–36.0)
MCV: 88.5 fL (ref 80.0–100.0)
Platelets: 265 10*3/uL (ref 150–400)
RBC: 4.61 MIL/uL (ref 3.87–5.11)
RDW: 12.5 % (ref 11.5–15.5)
WBC: 13.9 10*3/uL — ABNORMAL HIGH (ref 4.0–10.5)
nRBC: 0 % (ref 0.0–0.2)

## 2020-03-16 LAB — LIPASE, BLOOD: Lipase: 20 U/L (ref 11–51)

## 2020-03-16 LAB — URINALYSIS, ROUTINE W REFLEX MICROSCOPIC
Bilirubin Urine: NEGATIVE
Glucose, UA: NEGATIVE mg/dL
Ketones, ur: 20 mg/dL — AB
Nitrite: NEGATIVE
Protein, ur: 30 mg/dL — AB
Specific Gravity, Urine: >1.046 — ABNORMAL HIGH (ref 1.005–1.030)
pH: 6 (ref 5.0–8.0)

## 2020-03-16 LAB — HEPATIC FUNCTION PANEL
ALT: 15 U/L (ref 0–44)
AST: 31 U/L (ref 15–41)
Albumin: 3.5 g/dL (ref 3.5–5.0)
Alkaline Phosphatase: 43 U/L (ref 38–126)
Bilirubin, Direct: 0.5 mg/dL — ABNORMAL HIGH (ref 0.0–0.2)
Indirect Bilirubin: 1.2 mg/dL — ABNORMAL HIGH (ref 0.3–0.9)
Total Bilirubin: 1.7 mg/dL — ABNORMAL HIGH (ref 0.3–1.2)
Total Protein: 7.6 g/dL (ref 6.5–8.1)

## 2020-03-16 LAB — BASIC METABOLIC PANEL
Anion gap: 13 (ref 5–15)
BUN: 9 mg/dL (ref 6–20)
CO2: 20 mmol/L — ABNORMAL LOW (ref 22–32)
Calcium: 9 mg/dL (ref 8.9–10.3)
Chloride: 98 mmol/L (ref 98–111)
Creatinine, Ser: 1.03 mg/dL — ABNORMAL HIGH (ref 0.44–1.00)
GFR, Estimated: >60 mL/min (ref >60)
Glucose, Bld: 140 mg/dL — ABNORMAL HIGH (ref 70–99)
Potassium: 4.1 mmol/L (ref 3.5–5.1)
Sodium: 131 mmol/L — ABNORMAL LOW (ref 135–145)

## 2020-03-16 MED ORDER — LEVOFLOXACIN 750 MG PO TABS: 750.0000 mg | ORAL_TABLET | Freq: Every day | ORAL | 0 refills | Status: AC

## 2020-03-16 MED ORDER — HYDROMORPHONE HCL 1 MG/ML IJ SOLN
0.5000 mg | Freq: Once | INTRAMUSCULAR | Status: AC
  Administered 2020-03-16: 0.5 mg via INTRAVENOUS
  Filled 2020-03-16: qty 1

## 2020-03-16 MED ORDER — KETOROLAC TROMETHAMINE 15 MG/ML IJ SOLN
15.0000 mg | Freq: Once | INTRAMUSCULAR | Status: AC
  Administered 2020-03-16: 15 mg via INTRAVENOUS
  Filled 2020-03-16: qty 1

## 2020-03-16 MED ORDER — ONDANSETRON HCL 4 MG PO TABS: 4.0000 mg | ORAL_TABLET | Freq: Three times a day (TID) | ORAL | 0 refills | Status: DC | PRN

## 2020-03-16 MED ORDER — OXYCODONE-ACETAMINOPHEN 5-325 MG PO TABS
1.0000 | ORAL_TABLET | ORAL | 0 refills | Status: DC | PRN
Start: 2020-03-16 — End: 2022-10-04

## 2020-03-16 MED ORDER — ONDANSETRON 4 MG PO TBDP
4.0000 mg | ORAL_TABLET | Freq: Once | ORAL | Status: AC
  Administered 2020-03-16: 4 mg via ORAL
  Filled 2020-03-16: qty 1

## 2020-03-16 MED ORDER — ONDANSETRON HCL 4 MG/2ML IJ SOLN: 4.0000 mg | Freq: Once | INTRAMUSCULAR | Status: DC

## 2020-03-16 MED ORDER — SODIUM CHLORIDE 0.9 % IV BOLUS
1000.0000 mL | Freq: Once | INTRAVENOUS | Status: AC
  Administered 2020-03-16: 1000 mL via INTRAVENOUS

## 2020-03-16 MED ORDER — ACETAMINOPHEN 325 MG PO TABS
650.0000 mg | ORAL_TABLET | Freq: Once | ORAL | Status: AC
  Administered 2020-03-16: 650 mg via ORAL
  Filled 2020-03-16: qty 2

## 2020-03-16 MED ORDER — ONDANSETRON HCL 4 MG/2ML IJ SOLN
4.0000 mg | Freq: Once | INTRAMUSCULAR | Status: AC
  Administered 2020-03-16: 4 mg via INTRAVENOUS
  Filled 2020-03-16: qty 2

## 2020-03-16 MED ORDER — IOHEXOL 300 MG/ML  SOLN
100.0000 mL | Freq: Once | INTRAMUSCULAR | Status: AC | PRN
  Administered 2020-03-16: 100 mL via INTRAVENOUS

## 2020-03-16 MED ORDER — SODIUM CHLORIDE 0.9 % IV SOLN
1.0000 g | Freq: Once | INTRAVENOUS | Status: AC
  Administered 2020-03-16: 1 g via INTRAVENOUS
  Filled 2020-03-16: qty 10

## 2020-03-16 NOTE — ED Triage Notes (Signed)
Pt presents with "recurrent" Right flank pain, N/V and lethargy  x1 week. Received abx Monday night w/no relief. Pt states this has been "recurrent since May"

## 2020-03-16 NOTE — ED Notes (Signed)
Pt resting in bed. NADN 

## 2020-03-16 NOTE — ED Provider Notes (Signed)
[facility] EMERGENCY DEPARTMENT Provider Note   CSN: [id number] Arrival date & time: 03/16/20  8115     History Chief Complaint  Patient presents with  . Flank Pain    Stephanie Palmer is a 49 y.o. female.  The history is provided by the patient and medical records. No language interpreter was used.  Abdominal Pain Pain location:  RUQ, RLQ and R flank Pain quality: aching and cramping   Pain radiates to:  Back Pain severity:  Severe Onset quality:  Gradual Duration:  8 days Timing:  Constant Progression:  Worsening Chronicity:  New Context: previous surgery and recent illness   Relieved by:  Nothing Worsened by:  Nothing Ineffective treatments:  Acetaminophen Associated symptoms: belching, chills, constipation, dysuria, fatigue, fever, nausea and vomiting   Associated symptoms: no chest pain, no cough, no diarrhea, no flatus, no hematuria, no melena, no shortness of breath, no vaginal bleeding and no vaginal discharge        Past Medical History:  Diagnosis Date  . Abnormal Pap smear   . Anemia   . Blood transfusion without reported diagnosis 1994  . Dysmenorrhea   . Heart murmur    during pregnancy  . Mitral valve prolapse    With pregnancy 20 years ago    Patient Active Problem List   Diagnosis Date Noted  . Severe dysmenorrhea 06/06/2018  . Status post total hysterectomy 06/06/2018  . History of robot-assisted laparoscopic hysterectomy 06/06/2018    Past Surgical History:  Procedure Laterality Date  . BREAST ENHANCEMENT SURGERY    . Colonscopy     2015  . CRYOTHERAPY  1999  . dental bone graft    . DILATION AND CURETTAGE OF UTERUS    . ROBOTIC ASSISTED LAPAROSCOPIC HYSTERECTOMY AND SALPINGECTOMY Bilateral 06/06/2018   Procedure: XI ROBOTIC ASSISTED LAPAROSCOPIC HYSTERECTOMY AND SALPINGECTOMY;  Surgeon: Servando Salina, MD;  Location: [facility];  Service: Gynecology;  Laterality: Bilateral;  . uterine polyps  removed       OB History    Gravida  2   Para  2   Term  2   Preterm      AB      Living  2     SAB      TAB      Ectopic      Multiple      Live Births  2           Family History  Problem Relation Age of Onset  . Colon cancer Brother   . Thyroid disease Maternal Aunt   . Heart disease Maternal Aunt   . Thyroid disease Maternal Grandmother   . Heart disease Maternal Grandmother   . Heart disease Maternal Aunt   . Diabetes Paternal Grandmother     Social History   Tobacco Use  . Smoking status: Never Smoker  . Smokeless tobacco: Never Used  Vaping Use  . Vaping Use: Never used  Substance Use Topics  . Alcohol use: Yes    Alcohol/week: 1.0 standard drink    Types: 1 Standard drinks or equivalent per week    Comment: 'super light weight'  . Drug use: No    Home Medications Prior to Admission medications   Medication Sig Start Date End Date Taking? Authorizing Provider  ALPRAZolam Duanne Moron) 0.5 MG tablet Take 1 tablet (0.5 mg total) by mouth 2 (two) times daily as needed for anxiety. 01/29/20   Addison Lank, PA-C  amphetamine-dextroamphetamine (  ADDERALL) 10 MG tablet Take 0.5-1 tablets (5-10 mg total) by mouth daily with breakfast. Patient not taking: Reported on 10/10/2019 05/30/18   Donnal Moat T, PA-C  amphetamine-dextroamphetamine (ADDERALL) 10 MG tablet Take 1 tablet (10 mg total) by mouth daily with breakfast. Patient not taking: Reported on 09/09/2018 07/01/18   Donnal Moat T, PA-C  amphetamine-dextroamphetamine (ADDERALL) 10 MG tablet Take 1 tablet (10 mg total) by mouth daily with breakfast. Patient not taking: Reported on 09/09/2018 07/29/18   Donnal Moat T, PA-C  amphetamine-dextroamphetamine (ADDERALL) 10 MG tablet Take 1 tablet (10 mg total) by mouth daily with breakfast. Patient not taking: Reported on 11/06/2018 08/28/18   Donnal Moat T, PA-C  B Complex-Folic Acid (B COMPLEX PLUS) TABS Take 1 tablet by mouth daily. 08/01/19   Addison Lank, PA-C  Cholecalciferol (VITAMIN D) 50 MCG (2000 UT) CAPS Take 1 capsule (2,000 Units total) by mouth daily. 08/01/19   Addison Lank, PA-C  ibuprofen (ADVIL,MOTRIN) 800 MG tablet Take 1 tablet (800 mg total) by mouth every 8 (eight) hours. Patient not taking: Reported on 09/09/2018 06/07/18   Servando Salina, MD  loratadine (CLARITIN) 10 MG tablet Take 10 mg by mouth daily.    [provider]  Multiple Vitamins-Minerals (MULTIVITAMIN WITH MINERALS) tablet Take 2 tablets by mouth daily.     [provider]  Omega-3 1000 MG CAPS Take 2,000 mg by mouth daily.     [provider]  Probiotic Product (PROBIOTIC PO) Take 1 capsule by mouth daily.     [provider]    Allergies    Patient has no known allergies.  Review of Systems   Review of Systems  Constitutional: Positive for chills, fatigue and fever. Negative for diaphoresis.  HENT: Negative for congestion.   Eyes: Negative for visual disturbance.  Respiratory: Negative for cough, chest tightness and shortness of breath.   Cardiovascular: Negative for chest pain.  Gastrointestinal: Positive for abdominal pain, constipation, nausea and vomiting. Negative for abdominal distention, blood in stool, diarrhea, flatus and melena.  Genitourinary: Positive for dysuria and flank pain. Negative for frequency, hematuria, pelvic pain, vaginal bleeding, vaginal discharge and vaginal pain.  Musculoskeletal: Positive for back pain. Negative for neck pain.  Skin: Negative for rash and wound.  Neurological: Negative for light-headedness and headaches.  Psychiatric/Behavioral: Negative for agitation.  All other systems reviewed and are negative.   Physical Exam Updated Vital Signs BP 107/69 (BP Location: Right Arm)   Pulse 99   Temp (!) 101.4 F (38.6 C) (Oral)   Resp 15   LMP 05/08/2018 (Exact Date)   SpO2 100%   Physical Exam Vitals and nursing note reviewed.  Constitutional:      General: She  is not in acute distress.    Appearance: Normal appearance. She is well-developed. She is not ill-appearing, toxic-appearing or diaphoretic.  HENT:     Head: Normocephalic and atraumatic.     Right Ear: External ear normal.     Left Ear: External ear normal.     Nose: Nose normal. No congestion or rhinorrhea.     Mouth/Throat:     Mouth: Mucous membranes are dry.     Pharynx: No oropharyngeal exudate or posterior oropharyngeal erythema.  Eyes:     Conjunctiva/sclera: Conjunctivae normal.     Pupils: Pupils are equal, round, and reactive to light.  Cardiovascular:     Rate and Rhythm: Normal rate and regular rhythm.     Pulses: Normal pulses.  Heart sounds: No murmur heard.   Pulmonary:     Effort: Pulmonary effort is normal. No respiratory distress.     Breath sounds: No stridor. No wheezing, rhonchi or rales.  Chest:     Chest wall: No tenderness.  Abdominal:     General: Abdomen is flat. Bowel sounds are normal. There is no distension.     Tenderness: There is abdominal tenderness in the right upper quadrant and right lower quadrant. There is no right CVA tenderness, left CVA tenderness, guarding or rebound.    Musculoskeletal:        General: Tenderness present.     Cervical back: Normal range of motion and neck supple.     Right lower leg: No edema.     Left lower leg: No edema.  Skin:    General: Skin is warm.     Capillary Refill: Capillary refill takes less than 2 seconds.     Coloration: Skin is not pale.     Findings: No erythema or rash.  Neurological:     General: No focal deficit present.     Mental Status: She is alert and oriented to person, place, and time.     Motor: No abnormal muscle tone.     Deep Tendon Reflexes: Reflexes are normal and symmetric.  Psychiatric:        Mood and Affect: Mood normal.     ED Results / Procedures / Treatments   Labs (all labs ordered are listed, but only abnormal results are displayed) Labs Reviewed  BASIC  METABOLIC PANEL - Abnormal; Notable for the following components:      Result Value   Sodium 131 (*)    CO2 20 (*)    Glucose, Bld 140 (*)    Creatinine, Ser 1.03 (*)    All other components within normal limits  CBC - Abnormal; Notable for the following components:   WBC 13.9 (*)    All other components within normal limits  HEPATIC FUNCTION PANEL - Abnormal; Notable for the following components:   Total Bilirubin 1.7 (*)    Bilirubin, Direct 0.5 (*)    Indirect Bilirubin 1.2 (*)    All other components within normal limits  RESPIRATORY PANEL BY RT PCR (FLU A&B, COVID)  URINE CULTURE  CULTURE, BLOOD (ROUTINE X 2)  CULTURE, BLOOD (ROUTINE X 2)  LIPASE, BLOOD  URINALYSIS, ROUTINE W REFLEX MICROSCOPIC    EKG None  Radiology CT ABDOMEN PELVIS W CONTRAST  Result Date: 03/16/2020 CLINICAL DATA:  Fever, chills, right-sided abdominal pain, pneumoturia EXAM: CT ABDOMEN AND PELVIS WITH CONTRAST TECHNIQUE: Multidetector CT imaging of the abdomen and pelvis was performed using the standard protocol following bolus administration of intravenous contrast. CONTRAST:  133mL OMNIPAQUE IOHEXOL 300 MG/ML  SOLN COMPARISON:  12/03/2019 FINDINGS: Lower chest: No acute pleural or parenchymal lung disease. Hepatobiliary: Indeterminate subcentimeter hypodensity right lobe liver most compatible with cyst. No other focal liver abnormalities. The gallbladder is grossly normal without cholelithiasis or cholecystitis. No biliary dilation. Pancreas: Unremarkable. No pancreatic ductal dilatation or surrounding inflammatory changes. Spleen: Normal in size without focal abnormality. Adrenals/Urinary Tract: There is marked heterogeneous enhancement of the right kidney, with striated nephrogram compatible with right-sided pyelonephritis. No evidence of renal abscess. Left kidney enhances normally. The adrenals and bladder are normal. Stomach/Bowel: No bowel obstruction or ileus. No bowel wall thickening or inflammatory  change. Vascular/Lymphatic: No significant vascular findings are present. No enlarged abdominal or pelvic lymph nodes. Reproductive: Normal follicle right ovary. No  adnexal masses. Uterus is surgically absent. Other: Trace pelvic free fluid. This is likely physiologic. No free gas. No abdominal wall hernia. Musculoskeletal: No acute or destructive bony lesions. Reconstructed images demonstrate no additional findings. IMPRESSION: 1. Right-sided pyelonephritis.  No evidence of renal abscess. 2. Trace pelvic free fluid, likely physiologic. Electronically Signed   By: Randa Ngo M.D.   On: 03/16/2020 15:45    Procedures Procedures (including critical care time)  Medications Ordered in ED Medications  cefTRIAXone (ROCEPHIN) 1 g in sodium chloride 0.9 % 100 mL IVPB (has no administration in time range)  acetaminophen (TYLENOL) tablet 650 mg (650 mg Oral Given 03/16/20 0934)  ondansetron (ZOFRAN-ODT) disintegrating tablet 4 mg (4 mg Oral Given 03/16/20 0934)  sodium chloride 0.9 % bolus 1,000 mL (1,000 mLs Intravenous New Bag/Given 03/16/20 1630)  iohexol (OMNIPAQUE) 300 MG/ML solution 100 mL (100 mLs Intravenous Contrast Given 03/16/20 1535)    ED Course  I have reviewed the triage vital signs and the nursing notes.  Pertinent labs & imaging results that were available during my care of the patient were reviewed by me and considered in my medical decision making (see chart for details).    MDM Rules/Calculators/A&P                          Stephanie Palmer is a 49 y.o. female with a past medical history significant for prior hysterectomy and bilateral salpingectomy, as well as recurrent urinary tract infections for the last few months who presents for 1 week with right-sided back pain, flank pain, abdominal pain, bubbles and foam in her urine, nausea/vomiting, fevers and chills, and malaise.  She reports that is the worst episode she has ever had with this.  She reports that on and off since May  she has been having recurrent UTIs and was started on antibiotics by her PCP with Bactrim over the weekend.  She says that the pain is been worsening and had nausea and vomiting today prompting her to seek evaluation.  She reports has not had any diarrhea but has not had a bowel movement for several days.  She reports the pain is primarily in the right side of her abdomen going around towards her right back.  It is in the right upper quadrant and right lower quadrant.  She denies any pelvic pain.  She denies any vaginal discharge or vaginal bleeding.  She reports that her urine has been foaming and bubbles coming out which is new.  She denies any history of diverticulitis, diverticulosis, appendicitis, or any fistulas in the past.  She denies history of kidney stone to her knowledge.  She has never had a CT scan she reports upper abdomen during these episodes.  She denies any chest pain, shortness breath, or cough and reports he is fully vaccinated for Covid.  She reports the pain is moderate at this time.  On exam, patient's temperature has improved in the emergency department.  She was given Tylenol this morning bring it down from one 1.4 down to 98.5.  She also has improvement in her tachycardia that was persistent on arrival.  On exam, her lungs were clear and chest and back were nontender.  She did not have CVA tenderness.  She did have tenderness in her right upper quadrant and right lower quadrant and right flank.  Bowel sounds were appreciated.  Has shared decision made conversation with patient we agreed to get further laboratory testing as well  as imaging to look for multiple etiologies of her right-sided symptoms including diverticulitis, fistula, abscess, infected stone, or obstruction.  Patient agrees with remaining n.p.o. with some IV fluids and she reports he does not want pain medicine yet.  Anticipate reassessment after work-up.  Patient CT scan confirms pyelonephritis on the right.  There  is no evidence of abscess, obstruction, or other acute intra-abdominal pathology.  Patient still needs to urinate so we will give her fluids.  I spoke to pharmacy as patient is failed Bactrim and they request we give her Rocephin after she urinates for the UA.  Given her well appearance, we anticipate she will be stable for discharge home and she wants to go home.  Pharmacy recommended Levaquin 750 once a day for 5 days which I ordered.  I also ordered pain medicine nausea medicine prescription.  Patient and family amenable to this plan of getting fluids, UA, Rocephin, then discharge.  Care transferred to oncoming team while waiting for this to complete.  Anticipate discharge after medication ministration after urinalysis.  Patient understands return precautions and follow-up instructions.  Patient will be discharged after work-up.   Final Clinical Impression(s) / ED Diagnoses Final diagnoses:  Right flank pain  Pyelonephritis  Fever, unspecified fever cause    Rx / DC Orders ED Discharge Orders         Ordered    levofloxacin (LEVAQUIN) 750 MG tablet  Daily        03/16/20 1653    ondansetron (ZOFRAN) 4 MG tablet  Every 8 hours PRN        03/16/20 1653    oxyCODONE-acetaminophen (PERCOCET/ROXICET) 5-325 MG tablet  Every 4 hours PRN        03/16/20 1653         Clinical Impression: 1. Right flank pain   2. Pyelonephritis   3. Fever, unspecified fever cause     Disposition: Anticipate discharge awaiting urinalysis and medication ministration prior to discharge.  Condition: Good  I have discussed the results, Dx and Tx plan with the pt(& family if present). He/she/they expressed understanding and agree(s) with the plan. Discharge instructions discussed at great length. Strict return precautions discussed and pt &/or family have verbalized understanding of the instructions. No further questions at time of discharge.    New Prescriptions   LEVOFLOXACIN (LEVAQUIN) 750 MG TABLET     Take 1 tablet (750 mg total) by mouth daily for 5 days.   ONDANSETRON (ZOFRAN) 4 MG TABLET    Take 1 tablet (4 mg total) by mouth every 8 (eight) hours as needed for nausea or vomiting.   OXYCODONE-ACETAMINOPHEN (PERCOCET/ROXICET) 5-325 MG TABLET    Take 1 tablet by mouth every 4 (four) hours as needed.    Follow Up: Fanny Bien, MD [address] [place] [address] [phone]      [facility] EMERGENCY DEPARTMENT [address] [id number] mc [place]       Kyanne Rials, Gwenyth Allegra, MD 03/16/20 [phone]

## 2020-03-16 NOTE — ED Provider Notes (Signed)
Signed out by Dr Sherry Ruffing to d/c to home when UA resulted, that pt has pyelo, and plan is to tx as outpatient.   UA c/w UTI. Pt has received ivf, and iv abx.  No nausea/vomiting. Pt is not toxic appearing. Is tolerating po. Pt currently appears stable for d/c.  Return precautions provided.      Lajean Saver, MD 03/16/20 [phone]

## 2020-03-16 NOTE — Discharge Instructions (Addendum)
Your imaging today confirmed pyelonephritis as the cause of the right flank pain, right back pain, rating towards her abdomen.  It was also the cause of your fevers, chills, and lab abnormalities.  I spoke with pharmacy and after you gave [place] the urine, you were given a dose of IV Rocephin.  As you are able to eat and drink, feel you are safe for discharge home to attempt outpatient management with a new antibiotic.  Pharmacy agreed with the Rocephin and followed by 5 days of Levaquin to be taken daily.  Please use this.  Please also use the pain medicine nausea medicine help with your symptoms.  No driving for the next 6 hours, or if taking the pain medication. Please push hydration.  Please rest.  If any symptoms change or worsen or you do not start to feel better in the next 48 hours, please return to the nearest emergency department for further work-up and management.

## 2020-03-17 LAB — URINE CULTURE

## 2020-03-21 LAB — CULTURE, BLOOD (ROUTINE X 2)
Culture: NO GROWTH
Culture: NO GROWTH

## 2020-03-23 DIAGNOSIS — N952 Postmenopausal atrophic vaginitis: Secondary | ICD-10-CM | POA: Diagnosis not present

## 2020-03-23 DIAGNOSIS — K219 Gastro-esophageal reflux disease without esophagitis: Secondary | ICD-10-CM | POA: Diagnosis not present

## 2020-03-23 DIAGNOSIS — N12 Tubulo-interstitial nephritis, not specified as acute or chronic: Secondary | ICD-10-CM | POA: Diagnosis not present

## 2020-03-23 DIAGNOSIS — N390 Urinary tract infection, site not specified: Secondary | ICD-10-CM | POA: Diagnosis not present

## 2020-04-12 DIAGNOSIS — Z23 Encounter for immunization: Secondary | ICD-10-CM | POA: Diagnosis not present

## 2020-04-13 DIAGNOSIS — F411 Generalized anxiety disorder: Secondary | ICD-10-CM | POA: Diagnosis not present

## 2020-04-14 DIAGNOSIS — N3000 Acute cystitis without hematuria: Secondary | ICD-10-CM | POA: Diagnosis not present

## 2020-04-20 ENCOUNTER — Other Ambulatory Visit: Payer: Self-pay | Admitting: Family Medicine

## 2020-04-20 DIAGNOSIS — Z1231 Encounter for screening mammogram for malignant neoplasm of breast: Secondary | ICD-10-CM

## 2020-04-28 DIAGNOSIS — Z90710 Acquired absence of both cervix and uterus: Secondary | ICD-10-CM | POA: Diagnosis not present

## 2020-04-28 DIAGNOSIS — Z01419 Encounter for gynecological examination (general) (routine) without abnormal findings: Secondary | ICD-10-CM | POA: Diagnosis not present

## 2020-04-28 DIAGNOSIS — N644 Mastodynia: Secondary | ICD-10-CM | POA: Diagnosis not present

## 2020-04-30 DIAGNOSIS — N644 Mastodynia: Secondary | ICD-10-CM | POA: Diagnosis not present

## 2020-04-30 DIAGNOSIS — N6021 Fibroadenosis of right breast: Secondary | ICD-10-CM | POA: Diagnosis not present

## 2020-04-30 DIAGNOSIS — R922 Inconclusive mammogram: Secondary | ICD-10-CM | POA: Diagnosis not present

## 2020-05-24 DIAGNOSIS — Z20822 Contact with and (suspected) exposure to covid-19: Secondary | ICD-10-CM | POA: Diagnosis not present

## 2020-06-09 DIAGNOSIS — K219 Gastro-esophageal reflux disease without esophagitis: Secondary | ICD-10-CM | POA: Diagnosis not present

## 2020-06-09 DIAGNOSIS — E782 Mixed hyperlipidemia: Secondary | ICD-10-CM | POA: Diagnosis not present

## 2020-06-09 DIAGNOSIS — N952 Postmenopausal atrophic vaginitis: Secondary | ICD-10-CM | POA: Diagnosis not present

## 2020-06-29 DIAGNOSIS — E782 Mixed hyperlipidemia: Secondary | ICD-10-CM | POA: Diagnosis not present

## 2020-06-29 DIAGNOSIS — Z780 Asymptomatic menopausal state: Secondary | ICD-10-CM | POA: Diagnosis not present

## 2020-06-29 DIAGNOSIS — N952 Postmenopausal atrophic vaginitis: Secondary | ICD-10-CM | POA: Diagnosis not present

## 2020-07-01 ENCOUNTER — Ambulatory Visit (INDEPENDENT_AMBULATORY_CARE_PROVIDER_SITE_OTHER): Payer: 59 | Admitting: Physician Assistant

## 2020-07-01 ENCOUNTER — Encounter: Payer: Self-pay | Admitting: Physician Assistant

## 2020-07-01 ENCOUNTER — Other Ambulatory Visit: Payer: Self-pay

## 2020-07-01 DIAGNOSIS — F902 Attention-deficit hyperactivity disorder, combined type: Secondary | ICD-10-CM | POA: Diagnosis not present

## 2020-07-01 DIAGNOSIS — F411 Generalized anxiety disorder: Secondary | ICD-10-CM | POA: Diagnosis not present

## 2020-07-01 DIAGNOSIS — G4700 Insomnia, unspecified: Secondary | ICD-10-CM

## 2020-07-01 DIAGNOSIS — R69 Illness, unspecified: Secondary | ICD-10-CM | POA: Diagnosis not present

## 2020-07-01 MED ORDER — ALPRAZOLAM 0.5 MG PO TABS: 0.5000 mg | ORAL_TABLET | Freq: Four times a day (QID) | ORAL | 0 refills | Status: DC | PRN

## 2020-07-01 NOTE — Progress Notes (Signed)
[facility]  Patient ID: Stephanie Palmer,  MRN: [id number]  PCP: Fanny Bien, MD  Date of Evaluation: 07/01/2020 Time spent:20 minutes  Chief Complaint:  Chief Complaint    Anxiety; Insomnia      HISTORY/CURRENT STATUS: Will need Xanax prescription earlier.  She is here requesting prescription for Xanax for either permission to have it filled early as she will be going to [place] for 4 to 5 weeks.  She is using the Xanax only twice a day, and sometimes not even that.  It is helpful with generalized anxiety and also helps her unwind so that she can go to sleep.  She functions normally while on it and probably because of it.  She is able to work and that is the reason she is going to [place] in the first place.  Not having panic attacks but more of a generalized sense of unease, like something bad may happen at any time.  She is able to enjoy things.  Energy and motivation are good.  She does not cry easily.  No isolating.  Personal hygiene is normal.  Appetite is good and weight is stable.  No suicidal or homicidal thoughts.  States that attention is good without easy distractibility.  Able to focus on things and finish tasks to completion.   Denies dizziness, syncope, seizures, numbness, tingling, tremor, tics, unsteady gait, slurred speech, confusion. Denies muscle or joint pain, stiffness, or dystonia.  Individual Medical History/ Review of Systems: Changes? :No    Past medications for mental health diagnoses include: Wellbutrin, Adderall, Xanax, Zoloft caused nausea  Allergies: Patient has no known allergies.  Current Medications:  Current Outpatient Medications:  .  B Complex-Folic Acid (B COMPLEX PLUS) TABS, Take 1 tablet by mouth daily., Disp: 30 tablet, Rfl: 11 .  Cholecalciferol (VITAMIN D) 50 MCG (2000 UT) CAPS, Take 1 capsule (2,000 Units total) by mouth daily., Disp: 30 capsule, Rfl: 11 .  Multiple Vitamins-Minerals (MULTIVITAMIN WITH MINERALS)  tablet, Take 2 tablets by mouth daily. , Disp: , Rfl:  .  Omega-3 1000 MG CAPS, Take 2,000 mg by mouth daily. , Disp: , Rfl:  .  omeprazole (PRILOSEC) 40 MG capsule, Take 40 mg by mouth daily., Disp: , Rfl:  .  Probiotic Product (PROBIOTIC PO), Take 1 capsule by mouth daily. , Disp: , Rfl:  .  ALPRAZolam (XANAX) 0.5 MG tablet, Take 1 tablet (0.5 mg total) by mouth 4 (four) times daily as needed for anxiety., Disp: 120 tablet, Rfl: 0 .  amphetamine-dextroamphetamine (ADDERALL) 10 MG tablet, Take 0.5-1 tablets (5-10 mg total) by mouth daily with breakfast. (Patient not taking: No sig reported), Disp: 30 tablet, Rfl: 0 .  amphetamine-dextroamphetamine (ADDERALL) 10 MG tablet, Take 1 tablet (10 mg total) by mouth daily with breakfast. (Patient not taking: No sig reported), Disp: 30 tablet, Rfl: 0 .  amphetamine-dextroamphetamine (ADDERALL) 10 MG tablet, Take 1 tablet (10 mg total) by mouth daily with breakfast. (Patient not taking: No sig reported), Disp: 30 tablet, Rfl: 0 .  amphetamine-dextroamphetamine (ADDERALL) 10 MG tablet, Take 1 tablet (10 mg total) by mouth daily with breakfast. (Patient not taking: No sig reported), Disp: 30 tablet, Rfl: 0 .  ibuprofen (ADVIL,MOTRIN) 800 MG tablet, Take 1 tablet (800 mg total) by mouth every 8 (eight) hours. (Patient not taking: No sig reported), Disp: 30 tablet, Rfl: 0 .  loratadine (CLARITIN) 10 MG tablet, Take 10 mg by mouth daily. (Patient not taking: Reported on 07/01/2020), Disp: , Rfl:  .  ondansetron (ZOFRAN) 4 MG tablet, Take 1 tablet (4 mg total) by mouth every 8 (eight) hours as needed for nausea or vomiting. (Patient not taking: Reported on 07/01/2020), Disp: 12 tablet, Rfl: 0 .  oxyCODONE-acetaminophen (PERCOCET/ROXICET) 5-325 MG tablet, Take 1 tablet by mouth every 4 (four) hours as needed. (Patient not taking: Reported on 07/01/2020), Disp: 15 tablet, Rfl: 0 Medication Side Effects: none  Family Medical/ Social History: Changes? Going to [place] for  4-5 weeks.   MENTAL HEALTH EXAM:  Last menstrual period 05/08/2018.There is no height or weight on file to calculate BMI.  General Appearance: Casual, Neat and Well Groomed  Eye Contact:  Good  Speech:  Clear and Coherent and Normal Rate  Volume:  Normal  Mood:  Euthymic  Affect:  Appropriate  Thought Process:  Goal Directed and Descriptions of Associations: Intact  Orientation:  Full (Time, Place, and Person)  Thought Content: Logical   Suicidal Thoughts:  No  Homicidal Thoughts:  No  Memory:  WNL  Judgement:  Good  Insight:  Good  Psychomotor Activity:  Normal  Concentration:  Concentration: Good  Recall:  Good  Fund of Knowledge: Good  Language: Good  Assets:  Desire for Improvement  ADL's:  Intact  Cognition: WNL  Prognosis:  Good    DIAGNOSES:    ICD-10-CM   1. Generalized anxiety disorder  F41.1   2. Insomnia, unspecified type  G47.00   3. Attention deficit hyperactivity disorder (ADHD), combined type  F90.2     Receiving Psychotherapy: No    RECOMMENDATIONS:  PDMP reviewed. I provided 20 minutes of face-to-face time during this encounter discussing the Xanax prescription and need while out of the country.  Continue Xanax 0.5 mg, 1 p.o. twice daily as needed. I am increasing the quantity as well as changing the directions to 1 p.o. 4 times a day as needed, simply because she will be out of the country for 4 to 5 weeks beginning around March 4. When she is due for Xanax refills when she gets back home, will prescribe 60 pills and document directions as 1 p.o. twice daily as needed again. Return in 6 months.   Donnal Moat, PA-C

## 2020-07-09 DIAGNOSIS — B029 Zoster without complications: Secondary | ICD-10-CM | POA: Diagnosis not present

## 2020-07-26 DIAGNOSIS — E782 Mixed hyperlipidemia: Secondary | ICD-10-CM | POA: Diagnosis not present

## 2020-07-26 DIAGNOSIS — N952 Postmenopausal atrophic vaginitis: Secondary | ICD-10-CM | POA: Diagnosis not present

## 2020-07-26 DIAGNOSIS — K219 Gastro-esophageal reflux disease without esophagitis: Secondary | ICD-10-CM | POA: Diagnosis not present

## 2020-07-28 ENCOUNTER — Ambulatory Visit: Payer: BC Managed Care – PPO | Admitting: Physician Assistant

## 2020-07-28 DIAGNOSIS — H5213 Myopia, bilateral: Secondary | ICD-10-CM | POA: Diagnosis not present

## 2020-08-21 ENCOUNTER — Other Ambulatory Visit: Payer: Self-pay | Admitting: Physician Assistant

## 2020-08-23 NOTE — Telephone Encounter (Signed)
controlled substance

## 2020-09-24 DIAGNOSIS — J309 Allergic rhinitis, unspecified: Secondary | ICD-10-CM | POA: Diagnosis not present

## 2020-09-24 DIAGNOSIS — B9689 Other specified bacterial agents as the cause of diseases classified elsewhere: Secondary | ICD-10-CM | POA: Diagnosis not present

## 2020-09-24 DIAGNOSIS — J069 Acute upper respiratory infection, unspecified: Secondary | ICD-10-CM | POA: Diagnosis not present

## 2020-09-24 DIAGNOSIS — J329 Chronic sinusitis, unspecified: Secondary | ICD-10-CM | POA: Diagnosis not present

## 2020-10-07 DIAGNOSIS — L989 Disorder of the skin and subcutaneous tissue, unspecified: Secondary | ICD-10-CM | POA: Diagnosis not present

## 2020-10-07 DIAGNOSIS — R590 Localized enlarged lymph nodes: Secondary | ICD-10-CM | POA: Diagnosis not present

## 2020-10-22 ENCOUNTER — Other Ambulatory Visit: Payer: Self-pay

## 2020-10-22 ENCOUNTER — Ambulatory Visit: Payer: 59 | Admitting: Physician Assistant

## 2020-10-22 ENCOUNTER — Encounter: Payer: Self-pay | Admitting: Physician Assistant

## 2020-10-22 DIAGNOSIS — G4700 Insomnia, unspecified: Secondary | ICD-10-CM | POA: Diagnosis not present

## 2020-10-22 DIAGNOSIS — F411 Generalized anxiety disorder: Secondary | ICD-10-CM | POA: Diagnosis not present

## 2020-10-22 DIAGNOSIS — R69 Illness, unspecified: Secondary | ICD-10-CM | POA: Diagnosis not present

## 2020-10-22 NOTE — Progress Notes (Signed)
[facility]  Patient ID: Stephanie Palmer,  MRN: [id number]  PCP: Fanny Bien, MD  Date of Evaluation: 10/22/2020  time spent:20 minutes  Chief Complaint:  Chief Complaint   Anxiety; Insomnia; Follow-up      HISTORY/CURRENT STATUS: Early for routine appt.   Has started needing 2 xanax at night.  She has a hard time relaxing and is unable to go to sleep if she does not take 2 pills.  She does feel generalized sense of unease, often without cause, and needs the Xanax during the day as well.  Not really having panic attacks.  She is able to enjoy things.  Energy and motivation are good.  She does not cry easily.  No isolating.  Personal hygiene is normal.  Appetite is good and weight is stable.  No suicidal or homicidal thoughts.  States that attention is good without easy distractibility.  Able to focus on things and finish tasks to completion.   Denies dizziness, syncope, seizures, numbness, tingling, tremor, tics, unsteady gait, slurred speech, confusion. Denies muscle or joint pain, stiffness, or dystonia.  Individual Medical History/ Review of Systems: Changes? :No    Past medications for mental health diagnoses include: Wellbutrin, Adderall, Xanax, Zoloft caused nausea  Allergies: Patient has no known allergies.  Current Medications:  Current Outpatient Medications:    ALPRAZolam (XANAX) 0.5 MG tablet, TAKE 1 TABLET(0.5 MG) BY MOUTH FOUR TIMES DAILY AS NEEDED FOR ANXIETY, Disp: 120 tablet, Rfl: 3   Cholecalciferol (VITAMIN D) 50 MCG (2000 UT) CAPS, Take 1 capsule (2,000 Units total) by mouth daily., Disp: 30 capsule, Rfl: 11   omeprazole (PRILOSEC) 40 MG capsule, Take 40 mg by mouth daily., Disp: , Rfl:    Probiotic Product (PROBIOTIC PO), Take 1 capsule by mouth daily. , Disp: , Rfl:    B Complex-Folic Acid (B COMPLEX PLUS) TABS, Take 1 tablet by mouth daily. (Patient not taking: Reported on 10/22/2020), Disp: 30 tablet, Rfl: 11   ibuprofen  (ADVIL,MOTRIN) 800 MG tablet, Take 1 tablet (800 mg total) by mouth every 8 (eight) hours. (Patient not taking: No sig reported), Disp: 30 tablet, Rfl: 0   loratadine (CLARITIN) 10 MG tablet, Take 10 mg by mouth daily. (Patient not taking: No sig reported), Disp: , Rfl:    Multiple Vitamins-Minerals (MULTIVITAMIN WITH MINERALS) tablet, Take 2 tablets by mouth daily.  (Patient not taking: Reported on 10/22/2020), Disp: , Rfl:    Omega-3 1000 MG CAPS, Take 2,000 mg by mouth daily.  (Patient not taking: Reported on 10/22/2020), Disp: , Rfl:    ondansetron (ZOFRAN) 4 MG tablet, Take 1 tablet (4 mg total) by mouth every 8 (eight) hours as needed for nausea or vomiting. (Patient not taking: No sig reported), Disp: 12 tablet, Rfl: 0   oxyCODONE-acetaminophen (PERCOCET/ROXICET) 5-325 MG tablet, Take 1 tablet by mouth every 4 (four) hours as needed. (Patient not taking: No sig reported), Disp: 15 tablet, Rfl: 0 Medication Side Effects: none  Family Medical/ Social History: Changes? Going to [place] in a week or so and will be gone for a month.  Her daughter is studying [place] and she is going with her.  MENTAL HEALTH EXAM:  Last menstrual period 05/08/2018.There is no height or weight on file to calculate BMI.  General Appearance: Casual, Neat and Well Groomed  Eye Contact:  Good  Speech:  Clear and Coherent and Normal Rate  Volume:  Normal  Mood:  Euthymic  Affect:  Appropriate  Thought Process:  Goal  Directed and Descriptions of Associations: Intact  Orientation:  Full (Time, Place, and Person)  Thought Content: Logical   Suicidal Thoughts:  No  Homicidal Thoughts:  No  Memory:  WNL  Judgement:  Good  Insight:  Good  Psychomotor Activity:  Normal  Concentration:  Concentration: Good  Recall:  Good  Fund of Knowledge: Good  Language: Good  Assets:  Desire for Improvement  ADL's:  Intact  Cognition: WNL  Prognosis:  Good    DIAGNOSES:    ICD-10-CM   1. Generalized anxiety disorder  F41.1      2. Insomnia, unspecified type  G47.00        Receiving Psychotherapy: No    RECOMMENDATIONS:  PDMP reviewed. I provided 20 minutes of face-to-face time during this encounter, including time spent before and after the visit records reviewed, medical decision making, and charting. We discussed anxiety and the need for Xanax.  We discussed tolerance and the reasoning of having to increase the dose or them out she is taking to get the same effect.  It is fine for her to take a total of 1 mg at bedtime and 0.5 during the day, but once she returns from her trip, we need to discuss going on something to help prevent the anxiety such as an SSRI, SNRI or BuSpar.  There are other ways to treat insomnia as well, specifically trazodone, or mirtazapine.  Again I do not want to make changes since she is leaving for [place] in a week or so.  She agrees and understands. Continue Xanax 0.5 mg, 1 p.o. 4 times daily as needed. Return to   [employer], [place]

## 2020-10-27 DIAGNOSIS — N644 Mastodynia: Secondary | ICD-10-CM | POA: Diagnosis not present

## 2020-10-27 DIAGNOSIS — R928 Other abnormal and inconclusive findings on diagnostic imaging of breast: Secondary | ICD-10-CM | POA: Diagnosis not present

## 2020-11-01 ENCOUNTER — Other Ambulatory Visit: Payer: Self-pay

## 2020-11-01 DIAGNOSIS — D241 Benign neoplasm of right breast: Secondary | ICD-10-CM | POA: Diagnosis not present

## 2020-11-01 DIAGNOSIS — N6315 Unspecified lump in the right breast, overlapping quadrants: Secondary | ICD-10-CM | POA: Diagnosis not present

## 2020-12-29 ENCOUNTER — Other Ambulatory Visit: Payer: Self-pay

## 2020-12-29 ENCOUNTER — Encounter: Payer: Self-pay | Admitting: Physician Assistant

## 2020-12-29 ENCOUNTER — Ambulatory Visit: Payer: 59 | Admitting: Physician Assistant

## 2020-12-29 DIAGNOSIS — G4700 Insomnia, unspecified: Secondary | ICD-10-CM | POA: Diagnosis not present

## 2020-12-29 DIAGNOSIS — F331 Major depressive disorder, recurrent, moderate: Secondary | ICD-10-CM | POA: Diagnosis not present

## 2020-12-29 DIAGNOSIS — F4311 Post-traumatic stress disorder, acute: Secondary | ICD-10-CM

## 2020-12-29 DIAGNOSIS — F411 Generalized anxiety disorder: Secondary | ICD-10-CM

## 2020-12-29 DIAGNOSIS — R69 Illness, unspecified: Secondary | ICD-10-CM | POA: Diagnosis not present

## 2020-12-29 DIAGNOSIS — F902 Attention-deficit hyperactivity disorder, combined type: Secondary | ICD-10-CM

## 2020-12-29 DIAGNOSIS — K219 Gastro-esophageal reflux disease without esophagitis: Secondary | ICD-10-CM | POA: Diagnosis not present

## 2020-12-29 DIAGNOSIS — N952 Postmenopausal atrophic vaginitis: Secondary | ICD-10-CM | POA: Diagnosis not present

## 2020-12-29 MED ORDER — BUSPIRONE HCL 15 MG PO TABS: ORAL_TABLET | ORAL | 1 refills | Status: DC

## 2020-12-29 NOTE — Progress Notes (Signed)
[facility]  Patient ID: Stephanie Palmer,  MRN: [id number]  PCP: Fanny Bien, MD  Date of Evaluation: 12/29/2020 time spent:30 minutes  Chief Complaint:  Chief Complaint   Anxiety; Follow-up     HISTORY/CURRENT STATUS: For routine med check.  Went to [place] since [place]. Sleep problems during that time d/t time change. Xanax helps with sleep. It's kind of difficult to say if the anxiety is better or not, b/c of changes of routine, they had to put their dog down yesterday, they might be moving. Feels on edge quite a bit, and tries her hardest to not take the xanax during the day, but maybe 3 days a week, and only 1 then. States that about 3 weeks ago, she felt something change and is less 'tense' than she has been recently. Takes Xanax routinely every night though, to help quiet her mind to go to sleep. Has racing thoughts and will struggle at night if she doesn't take it. No nightmares.  Patient denies loss of interest in usual activities and is able to enjoy things.  Denies decreased energy or motivation. Appetite has not changed.  No extreme sadness, tearfulness, or feelings of hopelessness. Denies suicidal or homicidal thoughts.  States that attention is good without easy distractibility.  Able to focus on things and finish tasks to completion.   Denies dizziness, syncope, seizures, numbness, tingling, tremor, tics, unsteady gait, slurred speech, confusion. Denies muscle or joint pain, stiffness, or dystonia.  Individual Medical History/ Review of Systems: Changes? :No    Past medications for mental health diagnoses include: Wellbutrin, Adderall, Xanax, Zoloft caused nausea  Allergies: Patient has no known allergies.  Current Medications:  Current Outpatient Medications:    ALPRAZolam (XANAX) 0.5 MG tablet, TAKE 1 TABLET(0.5 MG) BY MOUTH FOUR TIMES DAILY AS NEEDED FOR ANXIETY, Disp: 120 tablet, Rfl: 3   busPIRone (BUSPAR) 15 MG tablet, 1/3 po bid for 1 wk,  then 2/3 pill po bid for 1 wk, then 1 po bid., Disp: 60 tablet, Rfl: 1   omeprazole (PRILOSEC) 40 MG capsule, Take 40 mg by mouth daily., Disp: , Rfl:    Probiotic Product (PROBIOTIC PO), Take 1 capsule by mouth daily. , Disp: , Rfl:    B Complex-Folic Acid (B COMPLEX PLUS) TABS, Take 1 tablet by mouth daily. (Patient not taking: No sig reported), Disp: 30 tablet, Rfl: 11   Cholecalciferol (VITAMIN D) 50 MCG (2000 UT) CAPS, Take 1 capsule (2,000 Units total) by mouth daily. (Patient not taking: Reported on 12/29/2020), Disp: 30 capsule, Rfl: 11   ibuprofen (ADVIL,MOTRIN) 800 MG tablet, Take 1 tablet (800 mg total) by mouth every 8 (eight) hours. (Patient not taking: No sig reported), Disp: 30 tablet, Rfl: 0   loratadine (CLARITIN) 10 MG tablet, Take 10 mg by mouth daily. (Patient not taking: No sig reported), Disp: , Rfl:    Multiple Vitamins-Minerals (MULTIVITAMIN WITH MINERALS) tablet, Take 2 tablets by mouth daily.  (Patient not taking: No sig reported), Disp: , Rfl:    Omega-3 1000 MG CAPS, Take 2,000 mg by mouth daily.  (Patient not taking: No sig reported), Disp: , Rfl:    ondansetron (ZOFRAN) 4 MG tablet, Take 1 tablet (4 mg total) by mouth every 8 (eight) hours as needed for nausea or vomiting. (Patient not taking: No sig reported), Disp: 12 tablet, Rfl: 0   oxyCODONE-acetaminophen (PERCOCET/ROXICET) 5-325 MG tablet, Take 1 tablet by mouth every 4 (four) hours as needed. (Patient not taking: No sig reported),  Disp: 15 tablet, Rfl: 0 Medication Side Effects: none  Family Medical/ Social History: Changes? See HPI.   MENTAL HEALTH EXAM:  Last menstrual period 05/08/2018.There is no height or weight on file to calculate BMI.  General Appearance: Casual, Neat and Well Groomed  Eye Contact:  Good  Speech:  Clear and Coherent and Normal Rate  Volume:  Normal  Mood:  Euthymic  Affect:  Appropriate  Thought Process:  Goal Directed and Descriptions of Associations: Circumstantial  Orientation:   Full (Time, Place, and Person)  Thought Content: Logical   Suicidal Thoughts:  No  Homicidal Thoughts:  No  Memory:  WNL  Judgement:  Good  Insight:  Good  Psychomotor Activity:  Normal  Concentration:  Concentration: Good and Attention Span: Good  Recall:  Good  Fund of Knowledge: Good  Language: Good  Assets:  Desire for Improvement  ADL's:  Intact  Cognition: WNL  Prognosis:  Good    DIAGNOSES:    ICD-10-CM   1. Generalized anxiety disorder  F41.1     2. Insomnia, unspecified type  G47.00     3. Attention deficit hyperactivity disorder (ADHD), combined type  F90.2     4. Acute posttraumatic stress disorder  F43.11       Receiving Psychotherapy: No    RECOMMENDATIONS:  PDMP reviewed.  Last Xanax filled 12/17/2020 I provided 30 minutes of face to face time during this encounter, including time spent before and after the visit in records review, medical decision making, counseling concerning treatment options, and charting.  Discussed adding BuSpar.  This will help prevent the anxiety so that hopefully she will not need the Xanax as much.  Benefits, risks, side effects were discussed and she accepts. She's very leary of meds but thinks she will likely try this. She understands we need to add something to help prevent the anxiety, not only cont the benzo for prn.  Start Buspar 15 mg, 1/3 o bid for 1 wk, then 2/3 po bid for 1 wk, then 1 po bid. Continue Xanax 0.5 mg, 1 p.o. 4 times daily as needed. Re-start multivitamin, fish oil, B complex, vitamin D. Recommend therapy. Return in 6-8 weeks. If doesn't start the Buspar, ok to schedule appt around Thanksgiving.    Donnal Moat, PA-C

## 2021-01-02 DIAGNOSIS — G4700 Insomnia, unspecified: Secondary | ICD-10-CM | POA: Insufficient documentation

## 2021-01-02 DIAGNOSIS — F411 Generalized anxiety disorder: Secondary | ICD-10-CM | POA: Insufficient documentation

## 2021-01-02 DIAGNOSIS — F4311 Post-traumatic stress disorder, acute: Secondary | ICD-10-CM | POA: Insufficient documentation

## 2021-01-02 DIAGNOSIS — F902 Attention-deficit hyperactivity disorder, combined type: Secondary | ICD-10-CM | POA: Insufficient documentation

## 2021-02-02 DIAGNOSIS — N3000 Acute cystitis without hematuria: Secondary | ICD-10-CM | POA: Diagnosis not present

## 2021-02-14 DIAGNOSIS — Z0000 Encounter for general adult medical examination without abnormal findings: Secondary | ICD-10-CM | POA: Diagnosis not present

## 2021-02-14 DIAGNOSIS — E559 Vitamin D deficiency, unspecified: Secondary | ICD-10-CM | POA: Diagnosis not present

## 2021-02-16 DIAGNOSIS — Z0000 Encounter for general adult medical examination without abnormal findings: Secondary | ICD-10-CM | POA: Diagnosis not present

## 2021-02-16 DIAGNOSIS — Z23 Encounter for immunization: Secondary | ICD-10-CM | POA: Diagnosis not present

## 2021-02-16 DIAGNOSIS — K219 Gastro-esophageal reflux disease without esophagitis: Secondary | ICD-10-CM | POA: Diagnosis not present

## 2021-02-22 ENCOUNTER — Other Ambulatory Visit: Payer: Self-pay | Admitting: Physician Assistant

## 2021-02-22 DIAGNOSIS — K219 Gastro-esophageal reflux disease without esophagitis: Secondary | ICD-10-CM | POA: Diagnosis not present

## 2021-02-22 DIAGNOSIS — K5904 Chronic idiopathic constipation: Secondary | ICD-10-CM | POA: Diagnosis not present

## 2021-02-22 DIAGNOSIS — Z1211 Encounter for screening for malignant neoplasm of colon: Secondary | ICD-10-CM | POA: Diagnosis not present

## 2021-02-22 DIAGNOSIS — Z800 Family history of malignant neoplasm of digestive organs: Secondary | ICD-10-CM | POA: Diagnosis not present

## 2021-02-22 NOTE — Telephone Encounter (Signed)
Pt due back now. Please get scheduled with Helene Kelp

## 2021-02-23 NOTE — Telephone Encounter (Signed)
Pt has appt 11/10

## 2021-03-22 DIAGNOSIS — Z23 Encounter for immunization: Secondary | ICD-10-CM | POA: Diagnosis not present

## 2021-03-24 ENCOUNTER — Ambulatory Visit: Payer: 59 | Admitting: Physician Assistant

## 2021-03-24 ENCOUNTER — Encounter: Payer: Self-pay | Admitting: Physician Assistant

## 2021-03-24 ENCOUNTER — Other Ambulatory Visit: Payer: Self-pay

## 2021-03-24 DIAGNOSIS — G4700 Insomnia, unspecified: Secondary | ICD-10-CM | POA: Diagnosis not present

## 2021-03-24 DIAGNOSIS — F411 Generalized anxiety disorder: Secondary | ICD-10-CM

## 2021-03-24 DIAGNOSIS — R69 Illness, unspecified: Secondary | ICD-10-CM | POA: Diagnosis not present

## 2021-03-24 MED ORDER — BUSPIRONE HCL 15 MG PO TABS: ORAL_TABLET | ORAL | 1 refills | Status: DC

## 2021-03-24 MED ORDER — ALPRAZOLAM 0.5 MG PO TABS: ORAL_TABLET | ORAL | 2 refills | Status: DC

## 2021-03-24 NOTE — Progress Notes (Signed)
[facility]  Patient ID: Stephanie Palmer,  MRN: [id number]  PCP: Fanny Bien, MD  Date of Evaluation: 03/24/2021 time spent:20 minutes  Chief Complaint:  Chief Complaint   Anxiety; Follow-up      HISTORY/CURRENT STATUS: For routine med check.  Didn't start the Buspar. Was worried about it, "I'm a skeptic about everything." She didn't want to take it when she was first starting school, thinking it might cause some SE but in reality it might be from school and adjustment period. She does want to start it now though. Has fears of things that she has never had.  For example she is afraid of someone else looking over a balcony afraid that they will fall, it freaks her out.  Also her husband likes fast sports cars and her mind goes down a rabbit hole worrying about him.  She was never like this in the past.  No known trigger.  Has not started seeing a therapist yet.  Still uses Xanax, usually 2 only at night.  If she does not take it she cannot sleep.  Sometimes she will take 1 during the day to help calm her down.  Patient denies loss of interest in usual activities and is able to enjoy things.  Denies decreased energy or motivation.  Appetite has not changed.  No extreme sadness, tearfulness, or feelings of hopelessness.  Denies any changes in concentration, making decisions or remembering things.  Denies suicidal or homicidal thoughts.  Denies dizziness, syncope, seizures, numbness, tingling, tremor, tics, unsteady gait, slurred speech, confusion. Denies muscle or joint pain, stiffness, or dystonia.  Individual Medical History/ Review of Systems: Changes? :No    Past medications for mental health diagnoses include: Wellbutrin, Adderall, Xanax, Zoloft caused nausea  Allergies: Patient has no known allergies.  Current Medications:  Current Outpatient Medications:    ALPRAZolam (XANAX) 0.5 MG tablet, TAKE 1 TABLET(0.5 MG) BY MOUTH FOUR TIMES DAILY AS NEEDED FOR  ANXIETY, Disp: 120 tablet, Rfl: 0   B Complex-Folic Acid (B COMPLEX PLUS) TABS, Take 1 tablet by mouth daily., Disp: 30 tablet, Rfl: 11   Multiple Vitamins-Minerals (MULTIVITAMIN WITH MINERALS) tablet, Take 2 tablets by mouth daily., Disp: , Rfl:    Omega-3 1000 MG CAPS, Take 2,000 mg by mouth daily., Disp: , Rfl:    Probiotic Product (PROBIOTIC PO), Take 1 capsule by mouth daily. , Disp: , Rfl:    busPIRone (BUSPAR) 15 MG tablet, 1/3 po bid for 1 wk, then 2/3 pill po bid for 1 wk, then 1 po bid. (Patient not taking: Reported on 03/24/2021), Disp: 60 tablet, Rfl: 1   Cholecalciferol (VITAMIN D) 50 MCG (2000 UT) CAPS, Take 1 capsule (2,000 Units total) by mouth daily. (Patient not taking: Reported on 03/24/2021), Disp: 30 capsule, Rfl: 11   ibuprofen (ADVIL,MOTRIN) 800 MG tablet, Take 1 tablet (800 mg total) by mouth every 8 (eight) hours. (Patient not taking: No sig reported), Disp: 30 tablet, Rfl: 0   loratadine (CLARITIN) 10 MG tablet, Take 10 mg by mouth daily. (Patient not taking: No sig reported), Disp: , Rfl:    omeprazole (PRILOSEC) 40 MG capsule, Take 40 mg by mouth daily. (Patient not taking: Reported on 03/24/2021), Disp: , Rfl:    ondansetron (ZOFRAN) 4 MG tablet, Take 1 tablet (4 mg total) by mouth every 8 (eight) hours as needed for nausea or vomiting. (Patient not taking: No sig reported), Disp: 12 tablet, Rfl: 0   oxyCODONE-acetaminophen (PERCOCET/ROXICET) 5-325 MG tablet, Take 1  tablet by mouth every 4 (four) hours as needed. (Patient not taking: No sig reported), Disp: 15 tablet, Rfl: 0 Medication Side Effects: none  Family Medical/ Social History: Changes? See HPI.   MENTAL HEALTH EXAM:  Last menstrual period 05/08/2018.There is no height or weight on file to calculate BMI.  General Appearance: Casual, Neat and Well Groomed  Eye Contact:  Good  Speech:  Clear and Coherent and Normal Rate  Volume:  Normal  Mood:  Anxious  Affect:  Anxious  Thought Process:  Goal Directed  and Descriptions of Associations: Circumstantial  Orientation:  Full (Time, Place, and Person)  Thought Content: Logical   Suicidal Thoughts:  No  Homicidal Thoughts:  No  Memory:  WNL  Judgement:  Good  Insight:  Good  Psychomotor Activity:  Normal  Concentration:  Concentration: Good and Attention Span: Good  Recall:  Good  Fund of Knowledge: Good  Language: Good  Assets:  Desire for Improvement  ADL's:  Intact  Cognition: WNL  Prognosis:  Good    DIAGNOSES:    ICD-10-CM   1. Generalized anxiety disorder  F41.1     2. Insomnia, unspecified type  G47.00        Receiving Psychotherapy: No    RECOMMENDATIONS:  PDMP reviewed.  Last Xanax 02/22/2021. I provided 20 minutes of face to face time during this encounter, including time spent before and after the visit in records review, medical decision making, counseling pertinent to today's visit, and charting.  We again discussed the BuSpar.  She does want to try it now.  Explained that this can help with the irrational fears that she is having lately. Start Buspar 15 mg, 1/3 o bid for 1 wk, then 2/3 po bid for 1 wk, then 1 po bid. Continue Xanax 0.5 mg, 1 p.o. 4 times daily as needed. Continue multivitamin, fish oil, B complex, vitamin D. Return in 6 weeks.   Donnal Moat, PA-C

## 2021-05-04 DIAGNOSIS — Z1211 Encounter for screening for malignant neoplasm of colon: Secondary | ICD-10-CM | POA: Diagnosis not present

## 2021-05-04 DIAGNOSIS — K529 Noninfective gastroenteritis and colitis, unspecified: Secondary | ICD-10-CM | POA: Diagnosis not present

## 2021-05-04 DIAGNOSIS — Z800 Family history of malignant neoplasm of digestive organs: Secondary | ICD-10-CM | POA: Diagnosis not present

## 2021-05-04 DIAGNOSIS — K633 Ulcer of intestine: Secondary | ICD-10-CM | POA: Diagnosis not present

## 2021-05-12 ENCOUNTER — Encounter: Payer: Self-pay | Admitting: Physician Assistant

## 2021-05-12 ENCOUNTER — Ambulatory Visit: Payer: 59 | Admitting: Physician Assistant

## 2021-05-12 ENCOUNTER — Other Ambulatory Visit: Payer: Self-pay

## 2021-05-12 DIAGNOSIS — G4700 Insomnia, unspecified: Secondary | ICD-10-CM | POA: Diagnosis not present

## 2021-05-12 DIAGNOSIS — R69 Illness, unspecified: Secondary | ICD-10-CM | POA: Diagnosis not present

## 2021-05-12 DIAGNOSIS — F411 Generalized anxiety disorder: Secondary | ICD-10-CM

## 2021-05-12 MED ORDER — BUSPIRONE HCL 15 MG PO TABS: 15.0000 mg | ORAL_TABLET | Freq: Two times a day (BID) | ORAL | 0 refills | Status: DC

## 2021-05-12 NOTE — Progress Notes (Signed)
[facility]  Patient ID: Stephanie Palmer,  MRN: [id number]  PCP: Fanny Bien, MD  Date of Evaluation: 05/12/2021 time spent:20 minutes  Chief Complaint:  Chief Complaint   Anxiety; Follow-up     HISTORY/CURRENT STATUS: For routine med check.  She finally started the buspar. "I should have listened to you a lot sooner!" Describes how much better she feels as the absence of sx that she had been having.  She is no longer having chest tightness or palpitations and she is able to handle things like a conversation with a family member for example and not get really uptight about it like she would have in the past.  She is able to relax more and just feels great in general. Hasn't needed the Xanax during the day, very often anyway.  She still takes it at night so she can relax to go to sleep.  If she does not take it she has ruminating thoughts.  At some point she does want to wean off of that.  Patient denies loss of interest in usual activities and is able to enjoy things.  Had a really nice, quiet Christmas.  Denies decreased energy or motivation.  Appetite has not changed.  No extreme sadness, tearfulness, or feelings of hopelessness.  Denies any changes in concentration, making decisions or remembering things.  Denies suicidal or homicidal thoughts.  Denies dizziness, syncope, seizures, numbness, tingling, tremor, tics, unsteady gait, slurred speech, confusion. Denies muscle or joint pain, stiffness, or dystonia.  Individual Medical History/ Review of Systems: Changes? :No    Past medications for mental health diagnoses include: Wellbutrin, Adderall, Xanax, Zoloft caused nausea  Allergies: Patient has no known allergies.  Current Medications:  Current Outpatient Medications:    ALPRAZolam (XANAX) 0.5 MG tablet, TAKE 1 TABLET(0.5 MG) BY MOUTH FOUR TIMES DAILY AS NEEDED FOR ANXIETY, Disp: 120 tablet, Rfl: 2   B Complex-Folic Acid (B COMPLEX PLUS) TABS, Take 1  tablet by mouth daily., Disp: 30 tablet, Rfl: 11   Multiple Vitamins-Minerals (MULTIVITAMIN WITH MINERALS) tablet, Take 2 tablets by mouth daily., Disp: , Rfl:    Omega-3 1000 MG CAPS, Take 2,000 mg by mouth daily., Disp: , Rfl:    Probiotic Product (PROBIOTIC PO), Take 1 capsule by mouth daily. , Disp: , Rfl:    busPIRone (BUSPAR) 15 MG tablet, Take 1 tablet (15 mg total) by mouth 2 (two) times daily., Disp: 180 tablet, Rfl: 0   Cholecalciferol (VITAMIN D) 50 MCG (2000 UT) CAPS, Take 1 capsule (2,000 Units total) by mouth daily. (Patient not taking: Reported on 03/24/2021), Disp: 30 capsule, Rfl: 11   ibuprofen (ADVIL,MOTRIN) 800 MG tablet, Take 1 tablet (800 mg total) by mouth every 8 (eight) hours. (Patient not taking: Reported on 09/09/2018), Disp: 30 tablet, Rfl: 0   loratadine (CLARITIN) 10 MG tablet, Take 10 mg by mouth daily. (Patient not taking: Reported on 07/01/2020), Disp: , Rfl:    omeprazole (PRILOSEC) 40 MG capsule, Take 40 mg by mouth daily. (Patient not taking: Reported on 03/24/2021), Disp: , Rfl:    ondansetron (ZOFRAN) 4 MG tablet, Take 1 tablet (4 mg total) by mouth every 8 (eight) hours as needed for nausea or vomiting. (Patient not taking: Reported on 07/01/2020), Disp: 12 tablet, Rfl: 0   oxyCODONE-acetaminophen (PERCOCET/ROXICET) 5-325 MG tablet, Take 1 tablet by mouth every 4 (four) hours as needed. (Patient not taking: Reported on 07/01/2020), Disp: 15 tablet, Rfl: 0 Medication Side Effects: none  Family Medical/ Social History:  Changes? no   MENTAL HEALTH EXAM:  Last menstrual period 05/08/2018.There is no height or weight on file to calculate BMI.  General Appearance: Casual, Neat and Well Groomed  Eye Contact:  Good  Speech:  Clear and Coherent and Normal Rate  Volume:  Normal  Mood:  Euthymic  Affect:  Congruent  Thought Process:  Goal Directed and Descriptions of Associations: Circumstantial  Orientation:  Full (Time, Place, and Person)  Thought Content: Logical    Suicidal Thoughts:  No  Homicidal Thoughts:  No  Memory:  WNL  Judgement:  Good  Insight:  Good  Psychomotor Activity:  Normal  Concentration:  Concentration: Good and Attention Span: Good  Recall:  Good  Fund of Knowledge: Good  Language: Good  Assets:  Desire for Improvement  ADL's:  Intact  Cognition: WNL  Prognosis:  Good    DIAGNOSES:    ICD-10-CM   1. Generalized anxiety disorder  F41.1     2. Insomnia, unspecified type  G47.00         Receiving Psychotherapy: No    RECOMMENDATIONS:  PDMP reviewed.  Last Xanax filled 04/27/2021 I provided 20 minutes of face to face time during this encounter, including time spent before and after the visit in records review, medical decision making, counseling pertinent to today's visit, and charting.  I am glad to see her doing so well! We discussed going off, or decreasing the dose, of Xanax but we agree that now may not be a good time.  Wintertime is not a good time to change medications in my opinion.  She states winter can be difficult for her.  Since she is not needing the Xanax during the day very often that in itself is a very good sign and she may be able to decrease the Xanax dose at night or even get off of it, but if not it is fine for her to stay on it in the evenings.  We can discuss any change there at the next visit. Continue Buspar 15 mg, 1 po bid. Continue Xanax 0.5 mg, 1 p.o. 4 times daily as needed. Continue multivitamin, fish oil, B complex, vitamin D. Return in 3 months.   Donnal Moat, PA-C

## 2021-05-23 DIAGNOSIS — Z1231 Encounter for screening mammogram for malignant neoplasm of breast: Secondary | ICD-10-CM | POA: Diagnosis not present

## 2021-08-01 ENCOUNTER — Emergency Department (HOSPITAL_COMMUNITY): Payer: 59

## 2021-08-01 ENCOUNTER — Emergency Department (HOSPITAL_COMMUNITY)
Admission: EM | Admit: 2021-08-01 | Discharge: 2021-08-01 | Disposition: A | Discharge status: Home / Self Care | Payer: 59 | Attending: Emergency Medicine | Admitting: Emergency Medicine

## 2021-08-01 ENCOUNTER — Encounter (HOSPITAL_COMMUNITY): Payer: Self-pay

## 2021-08-01 DIAGNOSIS — R109 Unspecified abdominal pain: Secondary | ICD-10-CM | POA: Diagnosis not present

## 2021-08-01 DIAGNOSIS — R1031 Right lower quadrant pain: Secondary | ICD-10-CM | POA: Diagnosis not present

## 2021-08-01 DIAGNOSIS — R160 Hepatomegaly, not elsewhere classified: Secondary | ICD-10-CM | POA: Diagnosis not present

## 2021-08-01 LAB — COMPREHENSIVE METABOLIC PANEL
ALT: 15 U/L (ref 0–44)
AST: 18 U/L (ref 15–41)
Albumin: 4.1 g/dL (ref 3.5–5.0)
Alkaline Phosphatase: 27 U/L — ABNORMAL LOW (ref 38–126)
Anion gap: 6 (ref 5–15)
BUN: 9 mg/dL (ref 6–20)
CO2: 26 mmol/L (ref 22–32)
Calcium: 8.9 mg/dL (ref 8.9–10.3)
Chloride: 100 mmol/L (ref 98–111)
Creatinine, Ser: 0.65 mg/dL (ref 0.44–1.00)
GFR, Estimated: >60 mL/min (ref >60)
Glucose, Bld: 110 mg/dL — ABNORMAL HIGH (ref 70–99)
Potassium: 3.7 mmol/L (ref 3.5–5.1)
Sodium: 132 mmol/L — ABNORMAL LOW (ref 135–145)
Total Bilirubin: 0.4 mg/dL (ref 0.3–1.2)
Total Protein: 7 g/dL (ref 6.5–8.1)

## 2021-08-01 LAB — CBC WITH DIFFERENTIAL/PLATELET
Abs Immature Granulocytes: 0.01 10*3/uL (ref 0.00–0.07)
Basophils Absolute: 0.1 10*3/uL (ref 0.0–0.1)
Basophils Relative: 1 %
Eosinophils Absolute: 0.1 10*3/uL (ref 0.0–0.5)
Eosinophils Relative: 1 %
HCT: 42 % (ref 36.0–46.0)
Hemoglobin: 13.4 g/dL (ref 12.0–15.0)
Immature Granulocytes: 0 %
Lymphocytes Relative: 19 %
Lymphs Abs: 1.4 10*3/uL (ref 0.7–4.0)
MCH: 28.7 pg (ref 26.0–34.0)
MCHC: 31.9 g/dL (ref 30.0–36.0)
MCV: 89.9 fL (ref 80.0–100.0)
Monocytes Absolute: 0.5 10*3/uL (ref 0.1–1.0)
Monocytes Relative: 7 %
Neutro Abs: 5.3 10*3/uL (ref 1.7–7.7)
Neutrophils Relative %: 72 %
Platelets: 314 10*3/uL (ref 150–400)
RBC: 4.67 MIL/uL (ref 3.87–5.11)
RDW: 11.9 % (ref 11.5–15.5)
WBC: 7.3 10*3/uL (ref 4.0–10.5)
nRBC: 0 % (ref 0.0–0.2)

## 2021-08-01 LAB — TROPONIN I (HIGH SENSITIVITY)
Troponin I (High Sensitivity): 2 ng/L (ref <18)
Troponin I (High Sensitivity): <2 ng/L (ref <18)

## 2021-08-01 LAB — LIPASE, BLOOD: Lipase: 32 U/L (ref 11–51)

## 2021-08-01 LAB — URINALYSIS, ROUTINE W REFLEX MICROSCOPIC
Bilirubin Urine: NEGATIVE
Glucose, UA: NEGATIVE mg/dL
Hgb urine dipstick: NEGATIVE
Ketones, ur: 5 mg/dL — AB
Leukocytes,Ua: NEGATIVE
Nitrite: NEGATIVE
Protein, ur: NEGATIVE mg/dL
Specific Gravity, Urine: 1.025 (ref 1.005–1.030)
pH: 5 (ref 5.0–8.0)

## 2021-08-01 LAB — MAGNESIUM: Magnesium: 1.9 mg/dL (ref 1.7–2.4)

## 2021-08-01 MED ORDER — IOHEXOL 300 MG/ML  SOLN
100.0000 mL | Freq: Once | INTRAMUSCULAR | Status: AC | PRN
  Administered 2021-08-01: 100 mL via INTRAVENOUS

## 2021-08-01 MED ORDER — LACTATED RINGERS IV BOLUS
1000.0000 mL | Freq: Once | INTRAVENOUS | Status: AC
  Administered 2021-08-01: 1000 mL via INTRAVENOUS

## 2021-08-01 MED ORDER — KETOROLAC TROMETHAMINE 15 MG/ML IJ SOLN
15.0000 mg | Freq: Once | INTRAMUSCULAR | Status: AC
Start: 2021-08-01 — End: 2021-08-01
  Administered 2021-08-01: 15 mg via INTRAVENOUS
  Filled 2021-08-01: qty 1

## 2021-08-01 MED ORDER — MORPHINE SULFATE (PF) 4 MG/ML IV SOLN
4.0000 mg | Freq: Once | INTRAVENOUS | Status: AC
  Administered 2021-08-01: 4 mg via INTRAVENOUS
  Filled 2021-08-01: qty 1

## 2021-08-01 MED ORDER — ONDANSETRON HCL 4 MG/2ML IJ SOLN
4.0000 mg | Freq: Four times a day (QID) | INTRAMUSCULAR | Status: DC | PRN
Start: 2021-08-01 — End: 2021-08-01
  Administered 2021-08-01: 4 mg via INTRAVENOUS
  Filled 2021-08-01: qty 2

## 2021-08-01 NOTE — ED Triage Notes (Signed)
Pt presents with c/o right side flank pain. Pt reports the pain started over the weekend but has intensified today. Pt reports a hx of kidney stones.  ?

## 2021-08-01 NOTE — ED Notes (Signed)
Cancel order for I-stat beta. Pt had a hysterectomy.  ?

## 2021-08-01 NOTE — ED Provider Notes (Signed)
[facility]-EMERGENCY DEPT Provider Note   CSN: [id number] Arrival date & time: 08/01/21  1054     History  Chief Complaint  Patient presents with   Flank Pain    Stephanie Palmer is a 51 y.o. female.   Flank Pain Patient presents for right flank pain.  Onset was last several days.  It has worsened today.  Her medical history includes PTSD, anxiety, ADHD, and mitral valve prolapse.  She reports a right-sided flank pain since yesterday.  Symptoms worsened today.  She has had normal bowel movements and she denies any recent nausea or vomiting.  Symptoms are improved with positioning herself upright and worsened with recumbency and twisting movements.  She denies any recent dysuria or hematuria.     Home Medications Prior to Admission medications   Medication Sig Start Date End Date Taking? Authorizing Provider  ALPRAZolam (XANAX) 0.5 MG tablet TAKE 1 TABLET(0.5 MG) BY MOUTH FOUR TIMES DAILY AS NEEDED FOR ANXIETY 03/24/21   Hurst, Rosey Bath T, PA-C  B Complex-Folic Acid (B COMPLEX PLUS) TABS Take 1 tablet by mouth daily. 08/01/19   Melony Overly T, PA-C  busPIRone (BUSPAR) 15 MG tablet Take 1 tablet (15 mg total) by mouth 2 (two) times daily. 05/12/21   Cherie Ouch, PA-C  Cholecalciferol (VITAMIN D) 50 MCG (2000 UT) CAPS Take 1 capsule (2,000 Units total) by mouth daily. Patient not taking: Reported on 03/24/2021 08/01/19   Melony Overly T, PA-C  ibuprofen (ADVIL,MOTRIN) 800 MG tablet Take 1 tablet (800 mg total) by mouth every 8 (eight) hours. Patient not taking: Reported on 09/09/2018 06/07/18   Maxie Better, MD  loratadine (CLARITIN) 10 MG tablet Take 10 mg by mouth daily. Patient not taking: Reported on 07/01/2020    [provider]  Multiple Vitamins-Minerals (MULTIVITAMIN WITH MINERALS) tablet Take 2 tablets by mouth daily.    [provider]  Omega-3 1000 MG CAPS Take 2,000 mg by mouth daily.    [provider]  omeprazole  (PRILOSEC) 40 MG capsule Take 40 mg by mouth daily. Patient not taking: Reported on 03/24/2021    [provider]  ondansetron (ZOFRAN) 4 MG tablet Take 1 tablet (4 mg total) by mouth every 8 (eight) hours as needed for nausea or vomiting. Patient not taking: Reported on 07/01/2020 03/16/20   Tegeler, Canary Brim, MD  oxyCODONE-acetaminophen (PERCOCET/ROXICET) 5-325 MG tablet Take 1 tablet by mouth every 4 (four) hours as needed. Patient not taking: Reported on 07/01/2020 03/16/20   Tegeler, Canary Brim, MD  Probiotic Product (PROBIOTIC PO) Take 1 capsule by mouth daily.     [provider]      Allergies    Patient has no known allergies.    Review of Systems   Review of Systems  Gastrointestinal:  Positive for nausea (Resolved).  Genitourinary:  Positive for flank pain.  All other systems reviewed and are negative.  Physical Exam Updated Vital Signs BP 125/80   Pulse 72   Temp (!) 97.1 F (36.2 C) (Oral)   Resp 17   Ht 5\' 7"  (1.702 m)   Wt 63.7 kg   LMP 05/08/2018 (Exact Date)   SpO2 98%   BMI 21.99 kg/m  Physical Exam Vitals and nursing note reviewed.  Constitutional:      General: She is not in acute distress.    Appearance: Normal appearance. She is well-developed and normal weight. She is not ill-appearing, toxic-appearing or diaphoretic.  HENT:     Head:  Normocephalic and atraumatic.     Right Ear: External ear normal.     Left Ear: External ear normal.     Nose: Nose normal.     Mouth/Throat:     Mouth: Mucous membranes are moist.     Pharynx: Oropharynx is clear.  Eyes:     Extraocular Movements: Extraocular movements intact.     Conjunctiva/sclera: Conjunctivae normal.  Cardiovascular:     Rate and Rhythm: Normal rate and regular rhythm.     Heart sounds: No murmur heard. Pulmonary:     Effort: Pulmonary effort is normal. No respiratory distress.  Abdominal:     General: Abdomen is flat. There is no distension.     Palpations: Abdomen  is soft.     Tenderness: There is no abdominal tenderness. There is no right CVA tenderness or left CVA tenderness.  Musculoskeletal:        General: No swelling. Normal range of motion.     Cervical back: Normal range of motion and neck supple.  Skin:    General: Skin is warm and dry.     Capillary Refill: Capillary refill takes less than 2 seconds.     Coloration: Skin is not jaundiced or pale.  Neurological:     General: No focal deficit present.     Mental Status: She is alert and oriented to person, place, and time.     Cranial Nerves: No cranial nerve deficit.     Sensory: No sensory deficit.     Motor: No weakness.     Coordination: Coordination normal.  Psychiatric:        Mood and Affect: Mood normal.        Behavior: Behavior normal.        Thought Content: Thought content normal.        Judgment: Judgment normal.    ED Results / Procedures / Treatments   Labs (all labs ordered are listed, but only abnormal results are displayed) Labs Reviewed  URINALYSIS, ROUTINE W REFLEX MICROSCOPIC - Abnormal; Notable for the following components:      Result Value   Ketones, ur 5 (*)    All other components within normal limits  COMPREHENSIVE METABOLIC PANEL - Abnormal; Notable for the following components:   Sodium 132 (*)    Glucose, Bld 110 (*)    Alkaline Phosphatase 27 (*)    All other components within normal limits  URINE CULTURE  LIPASE, BLOOD  CBC WITH DIFFERENTIAL/PLATELET  MAGNESIUM  I-STAT BETA HCG BLOOD, ED ([facility], [facility], [facility] ONLY)  TROPONIN I (HIGH SENSITIVITY)  TROPONIN I (HIGH SENSITIVITY)    EKG EKG Interpretation  Date/Time:  Monday August 01 2021 12:57:45 EDT Ventricular Rate:  78 PR Interval:  151 QRS Duration: 78 QT Interval:  362 QTC Calculation: 413 R Axis:   85 Text Interpretation: Sinus rhythm Anterior infarct, old Confirmed by Gloris Manchester [phone]) on 08/01/2021 1:16:33 PM  Radiology CT ABDOMEN PELVIS W CONTRAST  Result Date: 08/01/2021 CLINICAL  DATA:  Flank pain. EXAM: CT ABDOMEN AND PELVIS WITH CONTRAST TECHNIQUE: Multidetector CT imaging of the abdomen and pelvis was performed using the standard protocol following bolus administration of intravenous contrast. RADIATION DOSE REDUCTION: This exam was performed according to the departmental dose-optimization program which includes automated exposure control, adjustment of the mA and/or kV according to patient size and/or use of iterative reconstruction technique. CONTRAST:  OMNIPAQUE IOHEXOL 300 MG/ML  SOLN COMPARISON:  03/16/2020. FINDINGS: Lower chest: Minimal dependent atelectasis bilaterally. Heart  size normal. No pericardial or pleural effusion. Distal esophagus is grossly unremarkable. Hepatobiliary: Subcentimeter low-attenuation lesion in the right hepatic lobe is too small to characterize but unchanged and therefore likely benign. Liver is enlarged, 18.6 cm. Liver and gallbladder are otherwise unremarkable. No biliary ductal dilatation. Pancreas: Negative. Spleen: Negative. Adrenals/Urinary Tract: Adrenal glands and kidneys are unremarkable. Ureters are decompressed. Bladder is grossly unremarkable. Phlebolith in the left gonadal vein simulates a ureteral stone (2/42), unchanged from 03/16/2020. Bladder is grossly unremarkable. Stomach/Bowel: Stomach and small bowel are unremarkable. Appendix is poorly visualized. Colon is unremarkable. Vascular/Lymphatic: Vascular structures are unremarkable. No pathologically enlarged lymph nodes. Reproductive: Uterus is visualized. Probable physiologic cysts in the right ovary. No adnexal mass. Other: Small pelvic free fluid. Mesenteries and peritoneum are otherwise unremarkable. Musculoskeletal: None. IMPRESSION: 1. No acute findings to explain the patient's pain. 2. Hepatomegaly. 3. Small pelvic free fluid. Electronically Signed   By: Leanna Battles M.D.   On: 08/01/2021 15:04    Procedures Procedures    Medications Ordered in ED Medications   lactated ringers bolus 1,000 mL (0 mLs Intravenous Stopped 08/01/21 1627)  morphine (PF) 4 MG/ML injection 4 mg (4 mg Intravenous Given 08/01/21 1306)  iohexol (OMNIPAQUE) 300 MG/ML solution 100 mL (100 mLs Intravenous Contrast Given 08/01/21 1435)  ketorolac (TORADOL) 15 MG/ML injection 15 mg (15 mg Intravenous Given 08/01/21 1535)    ED Course/ Medical Decision Making/ A&P                           Medical Decision Making Amount and/or Complexity of Data Reviewed Labs: ordered. Radiology: ordered.  Risk Prescription drug management.   This patient presents to the ED for concern of right flank pain, this involves an extensive number of treatment options, and is a complaint that carries with it a high risk of complications and morbidity.  The differential diagnosis includes nephrolithiasis, pyelonephritis, colitis, constipation, ovarian cyst rupture, ovarian torsion   Co morbidities that complicate the patient evaluation  PTSD, anxiety, ADHD, and mitral valve prolapse   Additional history obtained:  Additional history obtained from N/A External records from outside source obtained and reviewed including EMR   Lab Tests:  I Ordered, and personally interpreted labs.  The pertinent results include: Normal findings, no evidence of UTI or hematuria   Imaging Studies ordered:  I ordered imaging studies including CT of abdomen and pelvis I independently visualized and interpreted imaging which showed trace amount of pelvic free fluid, otherwise no acute findings. I agree with the radiologist interpretation   Cardiac Monitoring: / EKG:  The patient was maintained on a cardiac monitor.  I personally viewed and interpreted the cardiac monitored which showed an underlying rhythm of: Sinus rhythm  Problem List / ED Course / Critical interventions / Medication management  Healthy 51 year old female presenting for right flank pain since yesterday.  She is well-appearing on exam.   Her vital signs are normal on arrival.  Area of pain is not tender.  There is no overlying skin change.  Patient reports no discomfort with urination and normal bowel movements, including today.  IV fluids and morphine were given.  Although patient denies any current nausea, she did have some nausea earlier today.  As needed Zofran was ordered.  Prior to being bedded in the ED, urine studies were obtained.  Urine shows no evidence of infection or hematuria.  Serum labs were ordered in addition to CT scan of abdomen pelvis.  Results of lab work were unremarkable.  CT scan showed trace amount of pelvic free fluid but no other acute findings.  Patient was informed of these reassuring results.  She may have had a kidney stone that has since passed.  She may have had ovarian cyst rupture with the small amount of free fluid causing discomfort.  She had improved symptoms while in the ED.  Toradol was given for additional analgesia.  She was advised to continue ibuprofen as needed and to return to the emergency department if symptoms worsen.  She was discharged in good condition. I ordered medication including morphine, IV fluids, Zofran, and Toradol for symptomatic relief Reevaluation of the patient after these medicines showed that the patient resolved I have reviewed the patients home medicines and have made adjustments as needed   Social Determinants of Health:  Has access to outpatient care         Final Clinical Impression(s) / ED Diagnoses Final diagnoses:  Right flank pain    Rx / DC Orders ED Discharge Orders     None         Gloris Manchester, MD 08/02/21 [phone]

## 2021-08-01 NOTE — ED Notes (Signed)
Pt states understanding of dc instructions, importance of follow up. Pt denies questions or concerns and declined transportation assistance upon dc. Pt ambulated w/ a steady gait w/o need for assistance. No belongings left in room upon dc. ? ?

## 2021-08-02 LAB — URINE CULTURE: Culture: NO GROWTH

## 2021-08-10 ENCOUNTER — Ambulatory Visit: Payer: 59 | Admitting: Physician Assistant

## 2021-08-10 DIAGNOSIS — N83209 Unspecified ovarian cyst, unspecified side: Secondary | ICD-10-CM | POA: Diagnosis not present

## 2021-08-10 DIAGNOSIS — R69 Illness, unspecified: Secondary | ICD-10-CM | POA: Diagnosis not present

## 2021-08-10 DIAGNOSIS — M545 Low back pain, unspecified: Secondary | ICD-10-CM | POA: Diagnosis not present

## 2021-08-10 DIAGNOSIS — L309 Dermatitis, unspecified: Secondary | ICD-10-CM | POA: Diagnosis not present

## 2021-08-24 ENCOUNTER — Telehealth (INDEPENDENT_AMBULATORY_CARE_PROVIDER_SITE_OTHER): Payer: 59 | Admitting: Physician Assistant

## 2021-08-24 ENCOUNTER — Encounter: Payer: Self-pay | Admitting: Physician Assistant

## 2021-08-24 DIAGNOSIS — G4700 Insomnia, unspecified: Secondary | ICD-10-CM

## 2021-08-24 DIAGNOSIS — F411 Generalized anxiety disorder: Secondary | ICD-10-CM | POA: Diagnosis not present

## 2021-08-24 DIAGNOSIS — R69 Illness, unspecified: Secondary | ICD-10-CM | POA: Diagnosis not present

## 2021-08-24 MED ORDER — BUSPIRONE HCL 15 MG PO TABS: 15.0000 mg | ORAL_TABLET | Freq: Two times a day (BID) | ORAL | 3 refills | Status: DC

## 2021-08-24 NOTE — Progress Notes (Signed)
[facility] ? ?Patient ID: Stephanie Palmer,  ?MRN: [id number] ? ?PCP: Fanny Bien, MD ? ?Date of Evaluation: 08/24/2021 ?time spent:20 minutes ? ?Chief Complaint:  ?Chief Complaint   ?Anxiety; Follow-up ?  ? ?Virtual Visit via Telehealth ? ?I connected with patient by a video enabled telemedicine application with their informed consent, and verified patient privacy and that I am speaking with the correct person using two identifiers.  I am private, in my office and the patient is in her car. ? ?I discussed the limitations, risks, security and privacy concerns of performing an evaluation and management service by video and the availability of in person appointments. I also discussed with the patient that there may be a patient responsible charge related to this service. The patient expressed understanding and agreed to proceed. ?  ?I discussed the assessment and treatment plan with the patient. The patient was provided an opportunity to ask questions and all were answered. The patient agreed with the plan and demonstrated an understanding of the instructions. ?  ?The patient was advised to call back or seek an in-person evaluation if the symptoms worsen or if the condition fails to improve as anticipated. ? ?I provided 20 minutes of non-face-to-face time during this encounter. ? ?HISTORY/CURRENT STATUS: ?For routine med check. ? ?Doing really well. New job as of 3 weeks ago. Likes it a lot.  See social history. ? ?Anxiety is very well controlled with BuSpar.  She does take the Xanax at night, and it helps relax her so she can fall asleep and rest well.  We have talked several times about decreasing the dose, more so at her request, but now is not really a good time, it is working well and since she just started this new job neither of [place] feel that it is a good idea to make a change.  She is not having panic attacks. ? ?Patient denies loss of interest in usual activities and is able to enjoy  things.  Denies decreased energy or motivation.  Appetite has not changed.  No extreme sadness, tearfulness, or feelings of hopelessness.  Denies any changes in concentration, making decisions or remembering things.  Denies suicidal or homicidal thoughts. ? ?Denies dizziness, syncope, seizures, numbness, tingling, tremor, tics, unsteady gait, slurred speech, confusion. Denies muscle or joint pain, stiffness, or dystonia. ? ?Individual Medical History/ Review of Systems: Changes? :No   ? ?Past medications for mental health diagnoses include: ?Wellbutrin, Adderall, Xanax, Zoloft caused nausea ? ?Allergies: Patient has no known allergies. ? ?Current Medications:  ?Current Outpatient Medications:  ?  ALPRAZolam (XANAX) 0.5 MG tablet, TAKE 1 TABLET(0.5 MG) BY MOUTH FOUR TIMES DAILY AS NEEDED FOR ANXIETY, Disp: 120 tablet, Rfl: 2 ?  B Complex-Folic Acid (B COMPLEX PLUS) TABS, Take 1 tablet by mouth daily., Disp: 30 tablet, Rfl: 11 ?  ibuprofen (ADVIL,MOTRIN) 800 MG tablet, Take 1 tablet (800 mg total) by mouth every 8 (eight) hours., Disp: 30 tablet, Rfl: 0 ?  Multiple Vitamins-Minerals (MULTIVITAMIN WITH MINERALS) tablet, Take 2 tablets by mouth daily., Disp: , Rfl:  ?  Omega-3 1000 MG CAPS, Take 2,000 mg by mouth daily., Disp: , Rfl:  ?  Probiotic Product (PROBIOTIC PO), Take 1 capsule by mouth daily. , Disp: , Rfl:  ?  busPIRone (BUSPAR) 15 MG tablet, Take 1 tablet (15 mg total) by mouth 2 (two) times daily., Disp: 180 tablet, Rfl: 3 ?  Cholecalciferol (VITAMIN D) 50 MCG (2000 UT) CAPS, Take 1 capsule (2,000  Units total) by mouth daily. (Patient not taking: Reported on 08/24/2021), Disp: 30 capsule, Rfl: 11 ?  famotidine (PEPCID) 40 MG tablet, Take 40 mg by mouth daily., Disp: , Rfl:  ?  loratadine (CLARITIN) 10 MG tablet, Take 10 mg by mouth daily. (Patient not taking: Reported on 07/01/2020), Disp: , Rfl:  ?  ondansetron (ZOFRAN) 4 MG tablet, Take 1 tablet (4 mg total) by mouth every 8 (eight) hours as needed for  nausea or vomiting. (Patient not taking: Reported on 07/01/2020), Disp: 12 tablet, Rfl: 0 ?  oxyCODONE-acetaminophen (PERCOCET/ROXICET) 5-325 MG tablet, Take 1 tablet by mouth every 4 (four) hours as needed. (Patient not taking: Reported on 07/01/2020), Disp: 15 tablet, Rfl: 0 ?Medication Side Effects: none ? ?Family Medical/ Social History: Changes? 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 ? ? ?MENTAL HEALTH EXAM: ? ?Last menstrual period 05/08/2018.There is no height or weight on file to calculate BMI.  ?General Appearance: Casual, Neat and Well Groomed  ?Eye Contact:  Good  ?Speech:  Clear and Coherent and Normal Rate  ?Volume:  Normal  ?Mood:  Euthymic  ?Affect:  Congruent  ?Thought Process:  Goal Directed and Descriptions of Associations: Circumstantial  ?Orientation:  Full (Time, Place, and Person)  ?Thought Content: Logical   ?Suicidal Thoughts:  No  ?Homicidal Thoughts:  No  ?Memory:  WNL  ?Judgement:  Good  ?Insight:  Good  ?Psychomotor Activity:  Normal  ?Concentration:  Concentration: Good and Attention Span: Good  ?Recall:  Good  ?Fund of Knowledge: Good  ?Language: Good  ?Assets:  Desire for Improvement ?Financial Resources/Insurance ?Housing ?Transportation ?Vocational/Educational  ?ADL's:  Intact  ?Cognition: WNL  ?Prognosis:  Good  ? ? ?DIAGNOSES:  ?  ICD-10-CM   ?1. Generalized anxiety disorder  F41.1   ?  ?2. Insomnia, unspecified type  G47.00   ?  ? ? ? ?Receiving Psychotherapy: No  ? ? ?RECOMMENDATIONS:  ?PDMP reviewed.  Last Xanax filled 08/19/2021. ?I provided 20 minutes of non-face-to-face time during this encounter, including time spent before and after the visit in records review, medical decision making, counseling pertinent to today's visit, and charting.  ?Congratulations on her new job! ?She is doing well so no changes need to be made at this time.  She does not take more than 1 Xanax daily, most of the time so if she needs to  stay at this dose for control of the anxiety and to help with sleep, that is fine.  We can talk about it again at the next appointment. ? ?Continue Buspar 15 mg, 1 po bid. ?Continue Xanax 0.5 mg, 1 p.o. 4 times daily as needed. ?Continue multivitamin, fish oil, B complex. ?Return in 4 months. ? ? Donnal Moat, PA-C  ?

## 2021-10-12 ENCOUNTER — Other Ambulatory Visit: Payer: Self-pay | Admitting: Physician Assistant

## 2021-10-12 NOTE — Telephone Encounter (Signed)
Last filled 08/19/21

## 2021-12-21 ENCOUNTER — Ambulatory Visit: Payer: 59 | Admitting: Physician Assistant

## 2021-12-21 ENCOUNTER — Encounter: Payer: Self-pay | Admitting: Physician Assistant

## 2021-12-21 DIAGNOSIS — F411 Generalized anxiety disorder: Secondary | ICD-10-CM | POA: Diagnosis not present

## 2021-12-21 DIAGNOSIS — G4700 Insomnia, unspecified: Secondary | ICD-10-CM | POA: Diagnosis not present

## 2021-12-21 DIAGNOSIS — R69 Illness, unspecified: Secondary | ICD-10-CM | POA: Diagnosis not present

## 2021-12-21 NOTE — Progress Notes (Signed)
[facility]  Patient ID: Stephanie Palmer,  MRN: [id number]  PCP: Fanny Bien, MD  Date of Evaluation: 12/21/2021 time spent:20 minutes  Chief Complaint:  Chief Complaint   Anxiety; Follow-up    HISTORY/CURRENT STATUS: For routine med check.  Anxiety got much higher when she started working back in the spring. She realized it wasn't a good fit (see SH) so left within a few months. Her husband has taken a leave of absence from his job Ecologist) and has decided not to go back to work. He 's never 'not worked' even on vacations. He's reading for pleasure, they've traveled a little, and he's in counseling now. It's kind of unsettling. He's looking at other positions and they may have to move. They're eating well, exercising.  Xanax does help with the anxiety.  The uncertainty is a part of the issue.  She is sleeping well, "probably because of the Xanax" but she feels rested when she gets up.  Patient is able to enjoy things.  Energy and motivation are good.  No extreme sadness, tearfulness, or feelings of hopelessness.  ADLs and personal hygiene are normal.   Denies any changes in concentration, making decisions, or remembering things.  Appetite has not changed.  Weight is stable.  Denies suicidal or homicidal thoughts.  Patient denies increased energy with decreased need for sleep, increased talkativeness, racing thoughts, impulsivity or risky behaviors, increased spending, increased libido, grandiosity, increased irritability or anger, paranoia, and no hallucinations.  Denies dizziness, syncope, seizures, numbness, tingling, tremor, tics, unsteady gait, slurred speech, confusion. Denies muscle or joint pain, stiffness, or dystonia.  Individual Medical History/ Review of Systems: Changes? :No    Past medications for mental health diagnoses include: Wellbutrin, Adderall, Xanax, Zoloft caused nausea  Allergies: Patient has no known allergies.  Current  Medications:  Current Outpatient Medications:    ALPRAZolam (XANAX) 0.5 MG tablet, TAKE 1 TABLET(0.5 MG) BY MOUTH FOUR TIMES DAILY AS NEEDED FOR ANXIETY, Disp: 120 tablet, Rfl: 2   B Complex-Folic Acid (B COMPLEX PLUS) TABS, Take 1 tablet by mouth daily., Disp: 30 tablet, Rfl: 11   busPIRone (BUSPAR) 15 MG tablet, Take 1 tablet (15 mg total) by mouth 2 (two) times daily., Disp: 180 tablet, Rfl: 3   Cholecalciferol (VITAMIN D) 50 MCG (2000 UT) CAPS, Take 1 capsule (2,000 Units total) by mouth daily., Disp: 30 capsule, Rfl: 11   famotidine (PEPCID) 40 MG tablet, Take 40 mg by mouth daily., Disp: , Rfl:    Multiple Vitamins-Minerals (MULTIVITAMIN WITH MINERALS) tablet, Take 2 tablets by mouth daily., Disp: , Rfl:    Omega-3 1000 MG CAPS, Take 2,000 mg by mouth daily., Disp: , Rfl:    Probiotic Product (PROBIOTIC PO), Take 1 capsule by mouth daily. , Disp: , Rfl:    ibuprofen (ADVIL,MOTRIN) 800 MG tablet, Take 1 tablet (800 mg total) by mouth every 8 (eight) hours. (Patient not taking: Reported on 12/21/2021), Disp: 30 tablet, Rfl: 0   loratadine (CLARITIN) 10 MG tablet, Take 10 mg by mouth daily. (Patient not taking: Reported on 07/01/2020), Disp: , Rfl:    ondansetron (ZOFRAN) 4 MG tablet, Take 1 tablet (4 mg total) by mouth every 8 (eight) hours as needed for nausea or vomiting. (Patient not taking: Reported on 07/01/2020), Disp: 12 tablet, Rfl: 0   oxyCODONE-acetaminophen (PERCOCET/ROXICET) 5-325 MG tablet, Take 1 tablet by mouth every 4 (four) hours as needed. (Patient not taking: Reported on 07/01/2020), Disp: 15 tablet, Rfl: 0 Medication Side  Effects: none  Family Medical/ Social History: Changes? 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   MENTAL HEALTH EXAM:  Last menstrual period 05/08/2018.There is no height or weight on file to calculate BMI.  General Appearance: Casual, Neat and Well Groomed  Eye Contact:  Good  Speech:  Clear and  Coherent and Normal Rate  Volume:  Normal  Mood:  Euthymic  Affect:  Congruent  Thought Process:  Goal Directed and Descriptions of Associations: Circumstantial  Orientation:  Full (Time, Place, and Person)  Thought Content: Logical   Suicidal Thoughts:  No  Homicidal Thoughts:  No  Memory:  WNL  Judgement:  Good  Insight:  Good  Psychomotor Activity:  Normal  Concentration:  Concentration: Good and Attention Span: Good  Recall:  Good  Fund of Knowledge: Good  Language: Good  Assets:  Desire for Improvement  ADL's:  Intact  Cognition: WNL  Prognosis:  Good    DIAGNOSES:    ICD-10-CM   1. Generalized anxiety disorder  F41.1     2. Insomnia, unspecified type  G47.00       Receiving Psychotherapy: No    RECOMMENDATIONS:  PDMP reviewed.  Last Xanax filled 12/08/2021. I provided 20 minutes of face to face time during this encounter, including time spent before and after the visit in records review, medical decision making, counseling pertinent to today's visit, and charting.   She is doing well with current regimen so no changes will be made.  If she does end up moving before we see each other next time, I will cover her medications for 3 months there after while she is looking for another provider, if she moves to another state.  She understands.  Continue Buspar 15 mg, 1 po bid. Continue Xanax 0.5 mg, 1 p.o. 4 times daily as needed. Continue multivitamin, fish oil, B complex. Return in 2-3 months.   Donnal Moat, PA-C

## 2021-12-22 DIAGNOSIS — R69 Illness, unspecified: Secondary | ICD-10-CM | POA: Diagnosis not present

## 2021-12-29 DIAGNOSIS — R69 Illness, unspecified: Secondary | ICD-10-CM | POA: Diagnosis not present

## 2022-01-05 DIAGNOSIS — R69 Illness, unspecified: Secondary | ICD-10-CM | POA: Diagnosis not present

## 2022-01-12 DIAGNOSIS — R69 Illness, unspecified: Secondary | ICD-10-CM | POA: Diagnosis not present

## 2022-01-26 DIAGNOSIS — R69 Illness, unspecified: Secondary | ICD-10-CM | POA: Diagnosis not present

## 2022-02-02 DIAGNOSIS — R69 Illness, unspecified: Secondary | ICD-10-CM | POA: Diagnosis not present

## 2022-02-09 DIAGNOSIS — R69 Illness, unspecified: Secondary | ICD-10-CM | POA: Diagnosis not present

## 2022-02-13 DIAGNOSIS — Z23 Encounter for immunization: Secondary | ICD-10-CM | POA: Diagnosis not present

## 2022-02-13 DIAGNOSIS — N390 Urinary tract infection, site not specified: Secondary | ICD-10-CM | POA: Diagnosis not present

## 2022-02-13 DIAGNOSIS — Z7185 Encounter for immunization safety counseling: Secondary | ICD-10-CM | POA: Diagnosis not present

## 2022-02-13 DIAGNOSIS — N952 Postmenopausal atrophic vaginitis: Secondary | ICD-10-CM | POA: Diagnosis not present

## 2022-02-13 DIAGNOSIS — N3000 Acute cystitis without hematuria: Secondary | ICD-10-CM | POA: Diagnosis not present

## 2022-02-16 ENCOUNTER — Ambulatory Visit: Payer: 59 | Admitting: Physician Assistant

## 2022-02-17 DIAGNOSIS — R509 Fever, unspecified: Secondary | ICD-10-CM | POA: Diagnosis not present

## 2022-02-17 DIAGNOSIS — R5383 Other fatigue: Secondary | ICD-10-CM | POA: Diagnosis not present

## 2022-02-17 DIAGNOSIS — N12 Tubulo-interstitial nephritis, not specified as acute or chronic: Secondary | ICD-10-CM | POA: Diagnosis not present

## 2022-02-20 ENCOUNTER — Ambulatory Visit: Payer: 59 | Admitting: Physician Assistant

## 2022-02-23 DIAGNOSIS — R69 Illness, unspecified: Secondary | ICD-10-CM | POA: Diagnosis not present

## 2022-03-14 ENCOUNTER — Ambulatory Visit (INDEPENDENT_AMBULATORY_CARE_PROVIDER_SITE_OTHER): Payer: 59 | Admitting: Physician Assistant

## 2022-03-14 ENCOUNTER — Encounter: Payer: Self-pay | Admitting: Physician Assistant

## 2022-03-14 DIAGNOSIS — F411 Generalized anxiety disorder: Secondary | ICD-10-CM

## 2022-03-14 DIAGNOSIS — R69 Illness, unspecified: Secondary | ICD-10-CM | POA: Diagnosis not present

## 2022-03-14 DIAGNOSIS — G4700 Insomnia, unspecified: Secondary | ICD-10-CM

## 2022-03-14 MED ORDER — TRAZODONE HCL 50 MG PO TABS: 25.0000 mg | ORAL_TABLET | Freq: Every day | ORAL | 1 refills | Status: DC

## 2022-03-14 MED ORDER — ALPRAZOLAM 0.5 MG PO TABS: 0.5000 mg | ORAL_TABLET | Freq: Four times a day (QID) | ORAL | 2 refills | Status: DC | PRN

## 2022-03-14 NOTE — Progress Notes (Signed)
[facility]  Patient ID: Stephanie Palmer,  MRN: [id number]  PCP: Fanny Bien, MD  Date of Evaluation: 03/14/2022 time spent:20 minutes  Chief Complaint:  Chief Complaint   Anxiety; Follow-up    HISTORY/CURRENT STATUS: For routine med check.  Not sleeping as well as she did. Always takes Xanax at night, 1 mg for awhile now, but it hasn't helped for about 2 weeks. Had a UTI and was really sick around that time, husband back at work after taking 3 months FMLA. She isn't working now, trying to figure out what she wants to do.  Has generalized anxiety but it is pretty stable.. Does need Xanax during day when she gets overwhelmed.   Patient is able to enjoy things.  Energy and motivation are good.  No extreme sadness, tearfulness, or feelings of hopelessness.   ADLs and personal hygiene are normal.   Denies any changes in concentration, making decisions, or remembering things.  Appetite has not changed.  Weight is stable. Denies suicidal or homicidal thoughts.  Patient denies increased energy with decreased need for sleep, increased talkativeness, racing thoughts, impulsivity or risky behaviors, increased spending, increased libido, grandiosity, increased irritability or anger, paranoia, or hallucinations.  Denies dizziness, syncope, seizures, numbness, tingling, tremor, tics, unsteady gait, slurred speech, confusion. Denies muscle or joint pain, stiffness, or dystonia.  Individual Medical History/ Review of Systems: Changes? :No    Past medications for mental health diagnoses include: Wellbutrin, Adderall, Xanax, Zoloft caused nausea  Allergies: Patient has no known allergies.  Current Medications:  Current Outpatient Medications:    B Complex-Folic Acid (B COMPLEX PLUS) TABS, Take 1 tablet by mouth daily., Disp: 30 tablet, Rfl: 11   busPIRone (BUSPAR) 15 MG tablet, Take 1 tablet (15 mg total) by mouth 2 (two) times daily., Disp: 180 tablet, Rfl: 3    Cholecalciferol (VITAMIN D) 50 MCG (2000 UT) CAPS, Take 1 capsule (2,000 Units total) by mouth daily., Disp: 30 capsule, Rfl: 11   famotidine (PEPCID) 40 MG tablet, Take 40 mg by mouth daily., Disp: , Rfl:    Multiple Vitamins-Minerals (MULTIVITAMIN WITH MINERALS) tablet, Take 2 tablets by mouth daily., Disp: , Rfl:    Omega-3 1000 MG CAPS, Take 2,000 mg by mouth daily., Disp: , Rfl:    Probiotic Product (PROBIOTIC PO), Take 1 capsule by mouth daily. , Disp: , Rfl:    traZODone (DESYREL) 50 MG tablet, Take 0.5-2 tablets (25-100 mg total) by mouth at bedtime., Disp: 60 tablet, Rfl: 1   ALPRAZolam (XANAX) 0.5 MG tablet, Take 1 tablet (0.5 mg total) by mouth 4 (four) times daily as needed for anxiety., Disp: 120 tablet, Rfl: 2   ibuprofen (ADVIL,MOTRIN) 800 MG tablet, Take 1 tablet (800 mg total) by mouth every 8 (eight) hours. (Patient not taking: Reported on 12/21/2021), Disp: 30 tablet, Rfl: 0   loratadine (CLARITIN) 10 MG tablet, Take 10 mg by mouth daily. (Patient not taking: Reported on 07/01/2020), Disp: , Rfl:    ondansetron (ZOFRAN) 4 MG tablet, Take 1 tablet (4 mg total) by mouth every 8 (eight) hours as needed for nausea or vomiting. (Patient not taking: Reported on 07/01/2020), Disp: 12 tablet, Rfl: 0   oxyCODONE-acetaminophen (PERCOCET/ROXICET) 5-325 MG tablet, Take 1 tablet by mouth every 4 (four) hours as needed. (Patient not taking: Reported on 07/01/2020), Disp: 15 tablet, Rfl: 0 Medication Side Effects: none  Family Medical/ Social History: Changes? See HPI  MENTAL HEALTH EXAM:  Last menstrual period 05/08/2018.There is no height or  weight on file to calculate BMI.  General Appearance: Casual, Neat and Well Groomed  Eye Contact:  Good  Speech:  Clear and Coherent and Normal Rate  Volume:  Normal  Mood:  Anxious  Affect:  Congruent  Thought Process:  Goal Directed and Descriptions of Associations: Circumstantial  Orientation:  Full (Time, Place, and Person)  Thought Content:  Logical   Suicidal Thoughts:  No  Homicidal Thoughts:  No  Memory:  WNL  Judgement:  Good  Insight:  Good  Psychomotor Activity:  Normal  Concentration:  Concentration: Good and Attention Span: Good  Recall:  Good  Fund of Knowledge: Good  Language: Good  Assets:  Desire for Improvement  ADL's:  Intact  Cognition: WNL  Prognosis:  Good   DIAGNOSES:    ICD-10-CM   1. Generalized anxiety disorder  F41.1     2. Insomnia, unspecified type  G47.00       Receiving Psychotherapy: No   RECOMMENDATIONS:  PDMP reviewed.  Last Xanax filled 02/03/2022. I provided 20 minutes of face to face time during this encounter, including time spent before and after the visit in records review, medical decision making, counseling pertinent to today's visit, and charting.   She has gone through a lot of major life changes in the past 6 months, and most recently in the past 2 to 3 weeks.  We discussed sleep hygiene.  She only has 1 cup of coffee in the morning so that does not seem to be the problem.  Recommend adding trazodone and leaving other meds the same at this time.  Benefits, risk and side effects were discussed and she accepts.  Continue Xanax 0.5 mg, 1 p.o. 4 times daily as needed. Continue BuSpar 15 mg, 1 p.o. twice daily. Continue multivitamin, fish oil, B complex. Return in 4 to 6 weeks.   Donnal Moat, PA-C

## 2022-03-16 ENCOUNTER — Encounter: Payer: Self-pay | Admitting: Physician Assistant

## 2022-03-16 ENCOUNTER — Ambulatory Visit (INDEPENDENT_AMBULATORY_CARE_PROVIDER_SITE_OTHER): Payer: 59 | Admitting: Physician Assistant

## 2022-03-16 DIAGNOSIS — G4700 Insomnia, unspecified: Secondary | ICD-10-CM | POA: Diagnosis not present

## 2022-03-16 DIAGNOSIS — F411 Generalized anxiety disorder: Secondary | ICD-10-CM | POA: Diagnosis not present

## 2022-03-16 DIAGNOSIS — R69 Illness, unspecified: Secondary | ICD-10-CM | POA: Diagnosis not present

## 2022-03-16 NOTE — Progress Notes (Signed)
[facility]  Patient ID: Stephanie Palmer,  MRN: [id number]  PCP: Fanny Bien, MD  Date of Evaluation: 03/16/2022 time spent:10 minutes  Chief Complaint:  Chief Complaint   Anxiety     HISTORY/CURRENT STATUS: Was seen 2 days ago. New development, she and husband might be moving to the [place] for his job. She's not sure if the xanax is legal there, and what to do if not. Only takes at night to help sleep. Buspar has helped the anxiety during the day a lot. She didn't get the Trazodone yet.  No sx of depression, no changes since 03/14/2022.  Energy and motivation are good.  Kind of excited aobut possibly moving to [place]. No extreme sadness, tearfulness, or feelings of hopelessness.   ADLs and personal hygiene are normal.   Denies any changes in concentration, making decisions, or remembering things.  Appetite has not changed.  Weight is stable.  Denies suicidal or homicidal thoughts.  Patient denies increased energy with decreased need for sleep, increased talkativeness, racing thoughts, impulsivity or risky behaviors, increased spending, increased libido, grandiosity, increased irritability or anger, paranoia, or hallucinations.  Denies dizziness, syncope, seizures, numbness, tingling, tremor, tics, unsteady gait, slurred speech, confusion. Denies muscle or joint pain, stiffness, or dystonia.  Individual Medical History/ Review of Systems: Changes? :No    Past medications for mental health diagnoses include: Wellbutrin, Adderall, Xanax, Zoloft caused nausea  Allergies: Patient has no known allergies.  Current Medications:  Current Outpatient Medications:    ALPRAZolam (XANAX) 0.5 MG tablet, Take 1 tablet (0.5 mg total) by mouth 4 (four) times daily as needed for anxiety., Disp: 120 tablet, Rfl: 2   B Complex-Folic Acid (B COMPLEX PLUS) TABS, Take 1 tablet by mouth daily., Disp: 30 tablet, Rfl: 11   busPIRone (BUSPAR) 15 MG tablet, Take 1 tablet (15  mg total) by mouth 2 (two) times daily., Disp: 180 tablet, Rfl: 3   Cholecalciferol (VITAMIN D) 50 MCG (2000 UT) CAPS, Take 1 capsule (2,000 Units total) by mouth daily., Disp: 30 capsule, Rfl: 11   famotidine (PEPCID) 40 MG tablet, Take 40 mg by mouth daily., Disp: , Rfl:    Multiple Vitamins-Minerals (MULTIVITAMIN WITH MINERALS) tablet, Take 2 tablets by mouth daily., Disp: , Rfl:    Omega-3 1000 MG CAPS, Take 2,000 mg by mouth daily., Disp: , Rfl:    Probiotic Product (PROBIOTIC PO), Take 1 capsule by mouth daily. , Disp: , Rfl:    traZODone (DESYREL) 50 MG tablet, Take 0.5-2 tablets (25-100 mg total) by mouth at bedtime., Disp: 60 tablet, Rfl: 1   ibuprofen (ADVIL,MOTRIN) 800 MG tablet, Take 1 tablet (800 mg total) by mouth every 8 (eight) hours. (Patient not taking: Reported on 12/21/2021), Disp: 30 tablet, Rfl: 0   loratadine (CLARITIN) 10 MG tablet, Take 10 mg by mouth daily. (Patient not taking: Reported on 07/01/2020), Disp: , Rfl:    ondansetron (ZOFRAN) 4 MG tablet, Take 1 tablet (4 mg total) by mouth every 8 (eight) hours as needed for nausea or vomiting. (Patient not taking: Reported on 07/01/2020), Disp: 12 tablet, Rfl: 0   oxyCODONE-acetaminophen (PERCOCET/ROXICET) 5-325 MG tablet, Take 1 tablet by mouth every 4 (four) hours as needed. (Patient not taking: Reported on 07/01/2020), Disp: 15 tablet, Rfl: 0 Medication Side Effects: none  Family Medical/ Social History: Changes? See HPI  MENTAL HEALTH EXAM:  Last menstrual period 05/08/2018.There is no height or weight on file to calculate BMI.  General Appearance: Casual, Neat  and Well Groomed  Eye Contact:  Good  Speech:  Clear and Coherent and Normal Rate  Volume:  Normal  Mood:  Euthymic  Affect:  Congruent  Thought Process:  Goal Directed and Descriptions of Associations: Circumstantial  Orientation:  Full (Time, Place, and Person)  Thought Content: Logical   Suicidal Thoughts:  No  Homicidal Thoughts:  No  Memory:  WNL   Judgement:  Good  Insight:  Good  Psychomotor Activity:  Normal  Concentration:  Concentration: Good and Attention Span: Good  Recall:  Good  Fund of Knowledge: Good  Language: Good  Assets:  Desire for Improvement  ADL's:  Intact  Cognition: WNL  Prognosis:  Good   DIAGNOSES:    ICD-10-CM   1. Generalized anxiety disorder  F41.1     2. Insomnia, unspecified type  G47.00       Receiving Psychotherapy: No   RECOMMENDATIONS:  PDMP reviewed.  Last Xanax filled 02/03/2022. I provided 10 minutes of face to face time during this encounter, including time spent before and after the visit in records review, medical decision making, counseling pertinent to today's visit, and charting.   Wrote down Klonopin, Ativan, and Valium, she is going to research what meds she can have in [place]. Will let me know if Xanax not legal and if one of those is, will change. Consider Hydroxyzine or increasing Buspar if anxiety worsens or Willette Alma if only for sleep.  Continue Xanax 0.5 mg, 1 p.o. 4 times daily as needed. Continue BuSpar 15 mg, 1 p.o. twice daily. Start Traz 50 mg, 1/2-2 qhs prn.  Continue multivitamin, fish oil, B complex. Return in 4 to 6 weeks.   Donnal Moat, PA-C

## 2022-03-20 DIAGNOSIS — M778 Other enthesopathies, not elsewhere classified: Secondary | ICD-10-CM | POA: Diagnosis not present

## 2022-03-20 DIAGNOSIS — M25812 Other specified joint disorders, left shoulder: Secondary | ICD-10-CM | POA: Diagnosis not present

## 2022-03-20 DIAGNOSIS — N952 Postmenopausal atrophic vaginitis: Secondary | ICD-10-CM | POA: Diagnosis not present

## 2022-03-20 DIAGNOSIS — N3000 Acute cystitis without hematuria: Secondary | ICD-10-CM | POA: Diagnosis not present

## 2022-03-20 DIAGNOSIS — K219 Gastro-esophageal reflux disease without esophagitis: Secondary | ICD-10-CM | POA: Diagnosis not present

## 2022-03-20 DIAGNOSIS — N390 Urinary tract infection, site not specified: Secondary | ICD-10-CM | POA: Diagnosis not present

## 2022-03-22 DIAGNOSIS — R69 Illness, unspecified: Secondary | ICD-10-CM | POA: Diagnosis not present

## 2022-03-22 DIAGNOSIS — Z114 Encounter for screening for human immunodeficiency virus [HIV]: Secondary | ICD-10-CM | POA: Diagnosis not present

## 2022-03-22 DIAGNOSIS — Z0000 Encounter for general adult medical examination without abnormal findings: Secondary | ICD-10-CM | POA: Diagnosis not present

## 2022-03-23 DIAGNOSIS — R69 Illness, unspecified: Secondary | ICD-10-CM | POA: Diagnosis not present

## 2022-03-29 DIAGNOSIS — Z0000 Encounter for general adult medical examination without abnormal findings: Secondary | ICD-10-CM | POA: Diagnosis not present

## 2022-03-30 DIAGNOSIS — R69 Illness, unspecified: Secondary | ICD-10-CM | POA: Diagnosis not present

## 2022-04-17 DIAGNOSIS — M25812 Other specified joint disorders, left shoulder: Secondary | ICD-10-CM | POA: Diagnosis not present

## 2022-04-17 DIAGNOSIS — R69 Illness, unspecified: Secondary | ICD-10-CM | POA: Diagnosis not present

## 2022-04-17 DIAGNOSIS — K219 Gastro-esophageal reflux disease without esophagitis: Secondary | ICD-10-CM | POA: Diagnosis not present

## 2022-04-17 DIAGNOSIS — G4700 Insomnia, unspecified: Secondary | ICD-10-CM | POA: Diagnosis not present

## 2022-04-18 ENCOUNTER — Ambulatory Visit: Payer: 59 | Admitting: Physician Assistant

## 2022-04-20 DIAGNOSIS — R69 Illness, unspecified: Secondary | ICD-10-CM | POA: Diagnosis not present

## 2022-05-31 DIAGNOSIS — Z1231 Encounter for screening mammogram for malignant neoplasm of breast: Secondary | ICD-10-CM | POA: Diagnosis not present

## 2022-06-06 DIAGNOSIS — M25812 Other specified joint disorders, left shoulder: Secondary | ICD-10-CM | POA: Diagnosis not present

## 2022-06-06 DIAGNOSIS — M170 Bilateral primary osteoarthritis of knee: Secondary | ICD-10-CM | POA: Diagnosis not present

## 2022-06-07 DIAGNOSIS — N6002 Solitary cyst of left breast: Secondary | ICD-10-CM | POA: Diagnosis not present

## 2022-06-07 DIAGNOSIS — R928 Other abnormal and inconclusive findings on diagnostic imaging of breast: Secondary | ICD-10-CM | POA: Diagnosis not present

## 2022-06-07 DIAGNOSIS — R922 Inconclusive mammogram: Secondary | ICD-10-CM | POA: Diagnosis not present

## 2022-08-30 DIAGNOSIS — M25812 Other specified joint disorders, left shoulder: Secondary | ICD-10-CM | POA: Diagnosis not present

## 2022-08-30 DIAGNOSIS — M7580 Other shoulder lesions, unspecified shoulder: Secondary | ICD-10-CM | POA: Diagnosis not present

## 2022-09-04 ENCOUNTER — Encounter: Payer: Self-pay | Admitting: Physician Assistant

## 2022-09-04 ENCOUNTER — Other Ambulatory Visit: Payer: Self-pay | Admitting: Family Medicine

## 2022-09-04 ENCOUNTER — Ambulatory Visit (INDEPENDENT_AMBULATORY_CARE_PROVIDER_SITE_OTHER): Payer: 59 | Admitting: Physician Assistant

## 2022-09-04 DIAGNOSIS — F411 Generalized anxiety disorder: Secondary | ICD-10-CM

## 2022-09-04 DIAGNOSIS — G4700 Insomnia, unspecified: Secondary | ICD-10-CM | POA: Diagnosis not present

## 2022-09-04 DIAGNOSIS — M25812 Other specified joint disorders, left shoulder: Secondary | ICD-10-CM

## 2022-09-04 MED ORDER — ALPRAZOLAM 0.5 MG PO TABS: 0.5000 mg | ORAL_TABLET | Freq: Four times a day (QID) | ORAL | 2 refills | Status: DC | PRN

## 2022-09-04 NOTE — Progress Notes (Signed)
[facility]  Patient ID: Stephanie Palmer,  MRN: [id number]  PCP: Lewis Moccasin, MD  Date of Evaluation: 09/04/2022 time spent:20 minutes  Chief Complaint:  Chief Complaint   Anxiety; Insomnia; Follow-up    HISTORY/CURRENT STATUS: For routine med check.  She weaned off the BuSpar, feeling that she did not need it for anxiety.  She is only taking the Xanax for sleep.  Does not feel anxious during the day.  No panic attacks.  She has had a lot of changes in her life, she and her husband were considering moving to the [place] for his job but decided against that.  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  She is ready for him to make 1 decision or another.  Patient is able to enjoy things.  Energy and motivation are good.  No extreme sadness, tearfulness, or feelings of hopelessness.  Sleeps well most of the time. ADLs and personal hygiene are normal.   Denies any changes in concentration, making decisions, or remembering things.  Appetite has not changed.  Weight is stable.  Denies suicidal or homicidal thoughts.  Denies dizziness, syncope, seizures, numbness, tingling, tremor, tics, unsteady gait, slurred speech, confusion. Denies muscle or joint pain, stiffness, or dystonia.  Individual Medical History/ Review of Systems: Changes? :No    Past medications for mental health diagnoses include: Wellbutrin, Adderall, Xanax, Zoloft caused nausea  Allergies: Patient has no known allergies.  Current Medications:  Current Outpatient Medications:    B Complex-Folic Acid (B COMPLEX PLUS) TABS, Take 1 tablet by mouth daily., Disp: 30 tablet, Rfl: 11   Cholecalciferol (VITAMIN D) 50 MCG (2000 UT) CAPS, Take 1 capsule (2,000 Units total) by mouth daily., Disp: 30 capsule, Rfl: 11   famotidine (PEPCID) 40 MG tablet, Take 40 mg by mouth daily., Disp: , Rfl:    Multiple Vitamins-Minerals (MULTIVITAMIN WITH MINERALS) tablet, Take 2 tablets by  mouth daily., Disp: , Rfl:    Omega-3 1000 MG CAPS, Take 2,000 mg by mouth daily., Disp: , Rfl:    Probiotic Product (PROBIOTIC PO), Take 1 capsule by mouth daily. , Disp: , Rfl:    ALPRAZolam (XANAX) 0.5 MG tablet, Take 1 tablet (0.5 mg total) by mouth 4 (four) times daily as needed for anxiety., Disp: 120 tablet, Rfl: 2   ibuprofen (ADVIL,MOTRIN) 800 MG tablet, Take 1 tablet (800 mg total) by mouth every 8 (eight) hours. (Patient not taking: Reported on 12/21/2021), Disp: 30 tablet, Rfl: 0   loratadine (CLARITIN) 10 MG tablet, Take 10 mg by mouth daily. (Patient not taking: Reported on 07/01/2020), Disp: , Rfl:    ondansetron (ZOFRAN) 4 MG tablet, Take 1 tablet (4 mg total) by mouth every 8 (eight) hours as needed for nausea or vomiting. (Patient not taking: Reported on 07/01/2020), Disp: 12 tablet, Rfl: 0   oxyCODONE-acetaminophen (PERCOCET/ROXICET) 5-325 MG tablet, Take 1 tablet by mouth every 4 (four) hours as needed. (Patient not taking: Reported on 07/01/2020), Disp: 15 tablet, Rfl: 0 Medication Side Effects: none  Family Medical/ Social History: Changes? She and husband bought a condo in [place] since her last visit. They're both from there, still has family there so that's been good. She and her husband have decided not to move to the [place] for him to work.   MENTAL HEALTH EXAM:  Last menstrual period 05/08/2018.There is no height or weight on file to calculate BMI.  General Appearance: Casual, Neat and Well  Groomed  Eye Contact:  Good  Speech:  Clear and Coherent and Normal Rate  Volume:  Normal  Mood:  Euthymic  Affect:  Congruent  Thought Process:  Goal Directed and Descriptions of Associations: Circumstantial  Orientation:  Full (Time, Place, and Person)  Thought Content: Logical   Suicidal Thoughts:  No  Homicidal Thoughts:  No  Memory:  WNL  Judgement:  Good  Insight:  Good  Psychomotor Activity:  Normal  Concentration:  Concentration: Good and Attention Span: Good   Recall:  Good  Fund of Knowledge: Good  Language: Good  Assets:  Desire for Improvement Financial Resources/Insurance Housing Transportation  ADL's:  Intact  Cognition: WNL  Prognosis:  Good   DIAGNOSES:    ICD-10-CM   1. Generalized anxiety disorder  F41.1     2. Insomnia, unspecified type  G47.00      Receiving Psychotherapy: No   RECOMMENDATIONS:  PDMP reviewed.  Last Xanax filled 05/11/2022. I provided 20 minutes of face to face time during this encounter, including time spent before and after the visit in records review, medical decision making, counseling pertinent to today's visit, and charting.   She is doing well overall so no changes will be made.  It is fine to stay off the BuSpar since she is only taking the Xanax in the evening to help her sleep.  Continue Xanax 0.5 mg, 1 p.o.q hs prn sleep.  (Written for 4 times daily as needed but she only takes at bedtime.) Continue multivitamin, fish oil, B complex. Return in 6 months.    Melony Overly, PA-C

## 2022-09-08 DIAGNOSIS — M799 Soft tissue disorder, unspecified: Secondary | ICD-10-CM | POA: Diagnosis not present

## 2022-09-08 DIAGNOSIS — M25612 Stiffness of left shoulder, not elsewhere classified: Secondary | ICD-10-CM | POA: Diagnosis not present

## 2022-09-08 DIAGNOSIS — M25512 Pain in left shoulder: Secondary | ICD-10-CM | POA: Diagnosis not present

## 2022-09-08 DIAGNOSIS — M6281 Muscle weakness (generalized): Secondary | ICD-10-CM | POA: Diagnosis not present

## 2022-09-13 DIAGNOSIS — M6281 Muscle weakness (generalized): Secondary | ICD-10-CM | POA: Diagnosis not present

## 2022-09-13 DIAGNOSIS — M25612 Stiffness of left shoulder, not elsewhere classified: Secondary | ICD-10-CM | POA: Diagnosis not present

## 2022-09-13 DIAGNOSIS — M25512 Pain in left shoulder: Secondary | ICD-10-CM | POA: Diagnosis not present

## 2022-09-13 DIAGNOSIS — M799 Soft tissue disorder, unspecified: Secondary | ICD-10-CM | POA: Diagnosis not present

## 2022-09-15 DIAGNOSIS — M799 Soft tissue disorder, unspecified: Secondary | ICD-10-CM | POA: Diagnosis not present

## 2022-09-15 DIAGNOSIS — M6281 Muscle weakness (generalized): Secondary | ICD-10-CM | POA: Diagnosis not present

## 2022-09-15 DIAGNOSIS — M25612 Stiffness of left shoulder, not elsewhere classified: Secondary | ICD-10-CM | POA: Diagnosis not present

## 2022-09-15 DIAGNOSIS — M25512 Pain in left shoulder: Secondary | ICD-10-CM | POA: Diagnosis not present

## 2022-09-19 DIAGNOSIS — M799 Soft tissue disorder, unspecified: Secondary | ICD-10-CM | POA: Diagnosis not present

## 2022-09-19 DIAGNOSIS — M25512 Pain in left shoulder: Secondary | ICD-10-CM | POA: Diagnosis not present

## 2022-09-19 DIAGNOSIS — M25612 Stiffness of left shoulder, not elsewhere classified: Secondary | ICD-10-CM | POA: Diagnosis not present

## 2022-09-19 DIAGNOSIS — M6281 Muscle weakness (generalized): Secondary | ICD-10-CM | POA: Diagnosis not present

## 2022-09-22 ENCOUNTER — Ambulatory Visit
Admission: RE | Admit: 2022-09-22 | Discharge: 2022-09-22 | Disposition: A | Discharge status: Home / Self Care | Payer: 59 | Source: Ambulatory Visit | Attending: Family Medicine | Admitting: Family Medicine

## 2022-09-22 DIAGNOSIS — M25512 Pain in left shoulder: Secondary | ICD-10-CM | POA: Diagnosis not present

## 2022-09-22 DIAGNOSIS — M25812 Other specified joint disorders, left shoulder: Secondary | ICD-10-CM

## 2022-09-23 ENCOUNTER — Other Ambulatory Visit: Payer: 59

## 2022-10-01 NOTE — Progress Notes (Unsigned)
Cardiology Office Note:   Date:  10/04/2022  NAME:  Stephanie Palmer    MRN: [id number] DOB:  May 06, 1971   PCP:  Lewis Moccasin, MD  Cardiologist:  None  Electrophysiologist:  None   Referring MD: Lewis Moccasin, MD   Chief Complaint  Patient presents with   Palpitations         History of Present Illness:   Stephanie Palmer is a 52 y.o. female with a hx of anxiety who is being seen today for the evaluation of palpitations at the request of Lewis Moccasin, MD. she reports for the past 3 to 4 months she has had intermittent episodes of palpitations and shortness of breath.  She describes heart racing episodes that can be associated with shortness of breath.  Symptoms occur without any identifiable triggers.  They last several minutes.  They resolved without any alleviating maneuvers or alleviating factors.  She reports being told she had mitral valve prolapse when she was pregnant 25 years ago.  She reports no chest pains or trouble breathing.  She walks several miles per day without limitations.  She lives between [place] and [place].  Apparently symptoms were happening when she was in [place].  She has had no further episodes in 3 to 4 weeks.  Denies any food association.  CV exam normal.  EKG normal.  No history of heart disease.  She has not had any labs this year.  No recent TSH.  Overall seems to be doing well.  Symptoms are not currently occurring.  Past Medical History: Past Medical History:  Diagnosis Date   Abnormal Pap smear    Anemia    Blood transfusion without reported diagnosis 1994   Dysmenorrhea    Heart murmur    during pregnancy   Mitral valve prolapse    With pregnancy 20 years ago    Past Surgical History: Past Surgical History:  Procedure Laterality Date   BREAST ENHANCEMENT SURGERY     Colonscopy     2015   CRYOTHERAPY  1999   dental bone graft     DILATION AND CURETTAGE OF UTERUS     ROBOTIC ASSISTED LAPAROSCOPIC HYSTERECTOMY  AND SALPINGECTOMY Bilateral 06/06/2018   Procedure: XI ROBOTIC ASSISTED LAPAROSCOPIC HYSTERECTOMY AND SALPINGECTOMY;  Surgeon: Maxie Better, MD;  Location[facility];  Service: Gynecology;  Laterality: Bilateral;   uterine polyps removed      Current Medications: Current Meds  Medication Sig   ALPRAZolam (XANAX) 0.5 MG tablet Take 1 tablet (0.5 mg total) by mouth 4 (four) times daily as needed for anxiety.   B Complex-Folic Acid (B COMPLEX PLUS) TABS Take 1 tablet by mouth daily.   Cholecalciferol (VITAMIN D) 50 MCG (2000 UT) CAPS Take 1 capsule (2,000 Units total) by mouth daily.   famotidine (PEPCID) 40 MG tablet Take 40 mg by mouth daily.   ibuprofen (ADVIL,MOTRIN) 800 MG tablet Take 1 tablet (800 mg total) by mouth every 8 (eight) hours.   Multiple Vitamins-Minerals (MULTIVITAMIN WITH MINERALS) tablet Take 2 tablets by mouth daily.   Omega-3 1000 MG CAPS Take 2,000 mg by mouth daily.   Probiotic Product (PROBIOTIC PO) Take 1 capsule by mouth daily.      Allergies:    Patient has no known allergies.   Social History: Social History   Socioeconomic History   Marital status: Married    Spouse name: Not on file   Number of children: 2   Years of education:  Not on file   Highest education level: Bachelor's degree (e.g., BA, AB, BS)  Occupational History   Occupation: artist  Tobacco Use   Smoking status: Never   Smokeless tobacco: Never  Vaping Use   Vaping Use: Never used  Substance and Sexual Activity   Alcohol use: Yes    Alcohol/week: 1.0 standard drink of alcohol    Types: 1 Standard drinks or equivalent per week    Comment: 'super light weight'   Drug use: No   Sexual activity: Yes    Partners: Male    Comment: husband vasectomy  Other Topics Concern   Not on file  Social History Narrative   Married 21 years.  First marriage.  2 kids, son and dtr. Janyth [place]up[place] and [place].   Grew up in [place].  Both parents were in home.  Older brother and 2  younger sisters. Never abused.    Moved to [place] in 2014 d/t husbands job at [employer].    Grew up [employer].    No legal issues.   Caffeine 1 cup coffee q am.   Social Determinants of Health   Financial Resource Strain: Low Risk  (05/20/2018)   Overall Financial Resource Strain (CARDIA)    Difficulty of Paying Living Expenses: Not hard at all  Food Insecurity: No Food Insecurity (05/20/2018)   Hunger Vital Sign    Worried About Running Out of Food in the Last Year: Never true    Ran Out of Food in the Last Year: Never true  Transportation Needs: No Transportation Needs (05/20/2018)   PRAPARE - Administrator, Civil Service (Medical): No    Lack of Transportation (Non-Medical): No  Physical Activity: Sufficiently Active (05/20/2018)   Exercise Vital Sign    Days of Exercise per Week: 5 days    Minutes of Exercise per Session: 40 min  Stress: Stress Concern Present (05/20/2018)   [employer] of Occupational Health - Occupational Stress Questionnaire    Feeling of Stress : To some extent  Social Connections: Moderately Integrated (05/20/2018)   Social Connection and Isolation Panel [NHANES]    Frequency of Communication with Friends and Family: More than three times a week    Frequency of Social Gatherings with Friends and Family: More than three times a week    Attends Religious Services: Never    Database administrator or Organizations: Yes    Attends Engineer, structural: More than 4 times per year    Marital Status: Married     Family History: The patient's family history includes Colon cancer in her brother; Diabetes in her paternal grandmother; Heart disease in her maternal aunt, maternal aunt, and maternal grandmother; Thyroid disease in her maternal aunt and maternal grandmother.  ROS:   All other ROS reviewed and negative. Pertinent positives noted in the HPI.     EKGs/Labs/Other Studies Reviewed:   The following studies were personally reviewed by me  today:  EKG:  EKG is ordered today.  The ekg ordered today demonstrates normal sinus rhythm heart rate 84, no acute ischemic changes or evidence of infarction, and was personally reviewed by me.   Recent Labs: No results found for requested labs within last 365 days.   Recent Lipid Panel No results found for: "CHOL", "TRIG", "HDL", "CHOLHDL", "VLDL", "LDLCALC", "LDLDIRECT"  Physical Exam:   VS:  BP 112/82   Pulse 84   Ht 5\' 7"  (1.702 m)   Wt 126 lb 9.6 oz (57.4 kg)  LMP 05/08/2018 (Exact Date)   SpO2 97%   BMI 19.83 kg/m    Wt Readings from Last 3 Encounters:  10/04/22 126 lb 9.6 oz (57.4 kg)  10/03/22 127 lb (57.6 kg)  08/01/21 140 lb 6 oz (63.7 kg)    General: Well nourished, well developed, in no acute distress Head: Atraumatic, normal size  Eyes: PEERLA, EOMI  Neck: Supple, no JVD Endocrine: No thryomegaly Cardiac: Normal S1, S2; RRR; no murmurs, rubs, or gallops Lungs: Clear to auscultation bilaterally, no wheezing, rhonchi or rales  Abd: Soft, nontender, no hepatomegaly  Ext: No edema, pulses 2+ Musculoskeletal: No deformities, BUE and BLE strength normal and equal Skin: Warm and dry, no rashes   Neuro: Alert and oriented to person, place, time, and situation, CNII-XII grossly intact, no focal deficits  Psych: Normal mood and affect   ASSESSMENT:   Stephanie Palmer is a 52 y.o. female who presents for the following: 1. Palpitations   2. SOB (shortness of breath) on exertion   3. Mitral valve prolapse     PLAN:    1. Palpitations 2. SOB (shortness of breath) on exertion 3. Mitral valve prolapse -She reports episodes of palpitations and shortness of breath for the past 3 to 4 months.  Symptoms were occurring in [place].  Not having any symptoms currently and none in the past 3 to 4 weeks.  CV exam normal.  EKG is normal.  I hear no murmurs.  She does report a history of mitral valve prolapse.  We discussed repeating heart ultrasound just to make sure there  is no mitral valve prolapse and to make sure everything is okay.  We also discussed that we should hold on a heart monitor given no symptoms currently.  We will check labs including CBC BMP and TSH.  She does need this updated.  If her palpitations come back we can order a heart monitor but at this time we will hold on this.  She will see [place] back as needed.  Disposition: Return if symptoms worsen or fail to improve.  Medication Adjustments/Labs and Tests Ordered: Current medicines are reviewed at length with the patient today.  Concerns regarding medicines are outlined above.  Orders Placed This Encounter  Procedures   CBC   Basic metabolic panel   TSH   EKG 12-Lead   ECHOCARDIOGRAM COMPLETE   No orders of the defined types were placed in this encounter.   Patient Instructions  Medication Instructions:  The current medical regimen is effective;  continue present plan and medications.  *If you need a refill on your cardiac medications before your next appointment, please call your pharmacy*   Lab Work: CBC, BMET, TSH today   If you have labs (blood work) drawn today and your tests are completely normal, you will receive your results only by: MyChart Message (if you have MyChart) OR A paper copy in the mail If you have any lab test that is abnormal or we need to change your treatment, we will call you to review the results.   Testing/Procedures: Echocardiogram - Your physician has requested that you have an echocardiogram. Echocardiography is a painless test that uses sound waves to create images of your heart. It provides your doctor with information about the size and shape of your heart and how well your heart's chambers and valves are working. This procedure takes approximately one hour. There are no restrictions for this procedure.     Follow-Up: At [facility], you and  your health needs are our priority.  As part of our continuing mission to provide you with  exceptional heart care, we have created designated Provider Care Teams.  These Care Teams include your primary Cardiologist (physician) and Advanced Practice Providers (APPs -  Physician Assistants and Nurse Practitioners) who all work together to provide you with the care you need, when you need it.  We recommend signing up for the patient portal called "MyChart".  Sign up information is provided on this After Visit Summary.  MyChart is used to connect with patients for Virtual Visits (Telemedicine).  Patients are able to view lab/test results, encounter notes, upcoming appointments, etc.  Non-urgent messages can be sent to your provider as well.   To learn more about what you can do with MyChart, go to [email or web address].    Your next appointment:   As needed  Provider:   Lennie Odor, MD       Signed, Lenna Gilford. Flora Lipps, MD, [facility]  [facility]  [address] [place], [place] [address] [phone]  10/04/2022 9:36 AM

## 2022-10-03 ENCOUNTER — Ambulatory Visit: Payer: 59 | Admitting: Sports Medicine

## 2022-10-03 VITALS — BP 110/68 | Ht 67.0 in | Wt 127.0 lb

## 2022-10-03 DIAGNOSIS — M7502 Adhesive capsulitis of left shoulder: Secondary | ICD-10-CM

## 2022-10-03 NOTE — Patient Instructions (Addendum)
We are going to refer you to  Dr. Obie Dredge for a high volume shoulder injection. [facility] [address] [place] [phone]  They will call you to schedule an appt.

## 2022-10-03 NOTE — Progress Notes (Signed)
Subjective:    Patient ID: Stephanie Palmer, female    DOB: November 29, 1970, 52 y.o.   MRN: [id number]  HPI  Patient is a 52 year old female with a history of left shoulder pain managed by her PCP/chiropractor for over the past year. She presents to clinic for with continued left shoulder pain and limited range of motion.  Per patient this issue first arose greater than 1 year ago with no inciting event.  Initially left shoulder was exquisitely painful.  During this time she lost her range of motion and was diagnosed with adhesive capsulitis by her PCP.  So far she has received treatment including 2 subacromial steroid injections, weekly massages, and daily physical therapy .  Says while at physical therapy she worked on both strengthening and range of motion exercises.  She has stopped going to physical therapy as of now due to limited improvement  but was being seen at [facility].  Overall treatment has been somewhat helpful and she feels like she has mildly improved but shoulder pain and limited mobility is still severely impacting her quality of life.  She also now has occasional numbness/tingling that radiates down into her left hand.  This only happens intermittently.  Primary concern is continued pain and limited range of motion impacting quality of life.   Review of Systems  Negative except as noted above    Objective:   Physical Exam Musculoskeletal:        General: Tenderness present. No swelling or deformity.     Comments: No erythema, ecchymoses, or generalized edema surrounding left shoulder.  Patient has extremity limited range of motion.  Only 45 degrees of abduction on left side compared to normal 180 degrees on right side.  Only 10 degrees of passive external rotation on left side compared to roughly 70 degrees on right side.  Patient is neurovascularly intact.  MRI shows intact rotator cuff musculature.  Left shoulder exam consistent with adhesive capsulitis.   Skin:    General:  Skin is warm and dry.     Capillary Refill: Capillary refill takes less than 2 seconds.     Findings: No bruising, erythema, lesion or rash.  Neurological:     General: No focal deficit present.     Mental Status: She is alert. Mental status is at baseline.     Motor: No weakness.     Coordination: Coordination normal.  Psychiatric:        Behavior: Behavior normal.        Thought Content: Thought content normal.        Judgment: Judgment normal.     MRI of the left shoulder done on Sep 22, 2022 shows findings consistent with adhesive capsulitis.  There is also some mild rotator cuff tendinopathy and bursitis.      Assessment & Plan:   Left shoulder adhesive capsulitis Exam consistent with adhesive capsulitis with severely limited shoulder mobility, especially with passive shoulder external rotation. Clinical Diagnosis consistent with recent L Shoulder MRI Will take multimodal approach to treatment including: Refer to Dr. Christell Faith @ [facility] for high-volume glenohumeral injection and consideration of suprascapular nerve block. Ref[facility] for strict range of motion exercises.  Avoid any strengthening exercises at this time. Anti-inflammatories like NSAIDs as needed for pain management Return to clinic in 4 weeks for further evaluation. If no better using conservative treatment modalities as described above could consider referral to orthopedic surgeon for surgical evaluation given long duration of symptoms without  relief.  Note was transcribed by into Fayrene Fearing, DO, PGY 2 and edited and reviewed by myself. Marsa Aris, DO

## 2022-10-04 ENCOUNTER — Encounter: Payer: Self-pay | Admitting: Cardiovascular Disease

## 2022-10-04 ENCOUNTER — Ambulatory Visit: Payer: 59 | Attending: Cardiovascular Disease | Admitting: Cardiovascular Disease

## 2022-10-04 VITALS — BP 112/82 | HR 84 | Ht 67.0 in | Wt 126.6 lb

## 2022-10-04 DIAGNOSIS — R002 Palpitations: Secondary | ICD-10-CM

## 2022-10-04 DIAGNOSIS — I341 Nonrheumatic mitral (valve) prolapse: Secondary | ICD-10-CM | POA: Diagnosis not present

## 2022-10-04 DIAGNOSIS — R0602 Shortness of breath: Secondary | ICD-10-CM

## 2022-10-04 NOTE — Patient Instructions (Signed)
Medication Instructions:  The current medical regimen is effective;  continue present plan and medications.  *If you need a refill on your cardiac medications before your next appointment, please call your pharmacy*   Lab Work: CBC, BMET, TSH today   If you have labs (blood work) drawn today and your tests are completely normal, you will receive your results only by: MyChart Message (if you have MyChart) OR A paper copy in the mail If you have any lab test that is abnormal or we need to change your treatment, we will call you to review the results.   Testing/Procedures: Echocardiogram - Your physician has requested that you have an echocardiogram. Echocardiography is a painless test that uses sound waves to create images of your heart. It provides your doctor with information about the size and shape of your heart and how well your heart's chambers and valves are working. This procedure takes approximately one hour. There are no restrictions for this procedure.     Follow-Up: At [facility], you and your health needs are our priority.  As part of our continuing mission to provide you with exceptional heart care, we have created designated Provider Care Teams.  These Care Teams include your primary Cardiologist (physician) and Advanced Practice Providers (APPs -  Physician Assistants and Nurse Practitioners) who all work together to provide you with the care you need, when you need it.  We recommend signing up for the patient portal called "MyChart".  Sign up information is provided on this After Visit Summary.  MyChart is used to connect with patients for Virtual Visits (Telemedicine).  Patients are able to view lab/test results, encounter notes, upcoming appointments, etc.  Non-urgent messages can be sent to your provider as well.   To learn more about what you can do with MyChart, go to [email or web address].    Your next appointment:   As needed  Provider:   Lennie Odor, MD

## 2022-10-05 LAB — BASIC METABOLIC PANEL
BUN/Creatinine Ratio: 11 (ref 9–23)
BUN: 8 mg/dL (ref 6–24)
CO2: 27 mmol/L (ref 20–29)
Calcium: 9.2 mg/dL (ref 8.7–10.2)
Chloride: 101 mmol/L (ref 96–106)
Creatinine, Ser: 0.75 mg/dL (ref 0.57–1.00)
Glucose: 105 mg/dL — ABNORMAL HIGH (ref 70–99)
Potassium: 5 mmol/L (ref 3.5–5.2)
Sodium: 139 mmol/L (ref 134–144)
eGFR: 97 mL/min/{1.73_m2} (ref >59)

## 2022-10-05 LAB — CBC
Hematocrit: 39.7 % (ref 34.0–46.6)
Hemoglobin: 12.6 g/dL (ref 11.1–15.9)
MCH: 27.8 pg (ref 26.6–33.0)
MCHC: 31.7 g/dL (ref 31.5–35.7)
MCV: 87 fL (ref 79–97)
Platelets: 324 10*3/uL (ref 150–450)
RBC: 4.54 x10E6/uL (ref 3.77–5.28)
RDW: 11.8 % (ref 11.7–15.4)
WBC: 4.5 10*3/uL (ref 3.4–10.8)

## 2022-10-05 LAB — TSH: TSH: 1.32 u[IU]/mL (ref 0.450–4.500)

## 2022-10-11 IMAGING — CT CT ABD-PELV W/ CM
2 of 5 series · 17 of 46 positions shown, 19 images · IV contrast (OMNIPAQUE 300)
Comparison: 03/16/2020.

CLINICAL DATA: Flank pain.

EXAM:
CT ABDOMEN AND PELVIS WITH CONTRAST
TECHNIQUE: Multidetector CT imaging of the abdomen and pelvis was performed
using the standard protocol following bolus administration of
intravenous contrast.

[Series 2: axial st · axial · 0.68mm/px · z∈[+594,+944]mm · 14 of 82 slices shown, 16 images]
[im 6/82  soft-tissue]
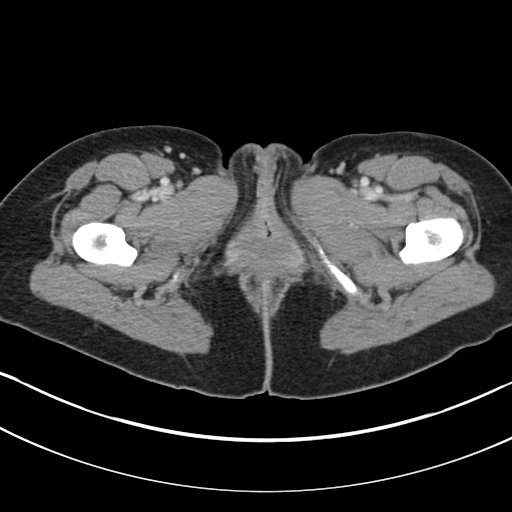
[im 6/82  bone]
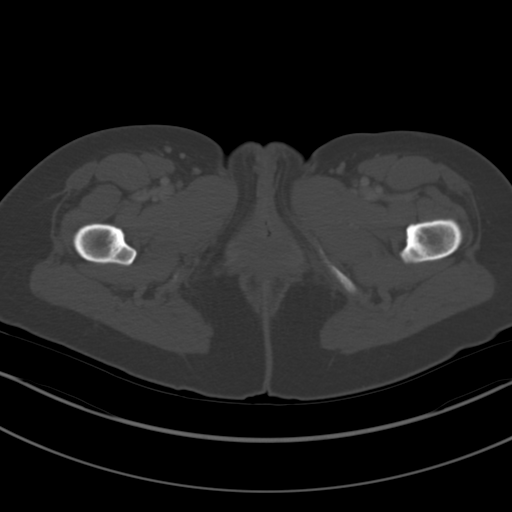
[im 11/82  soft-tissue]
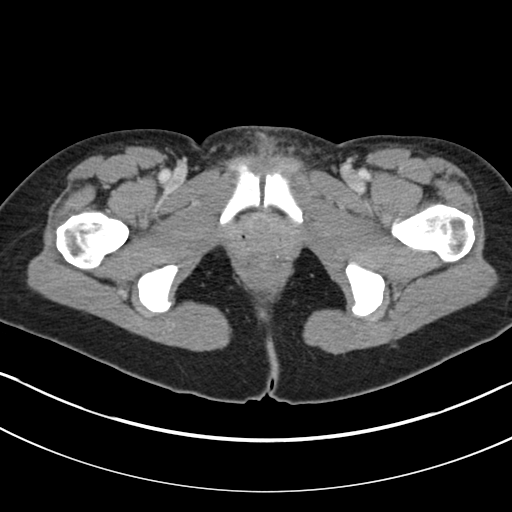
[im 16/82  soft-tissue]
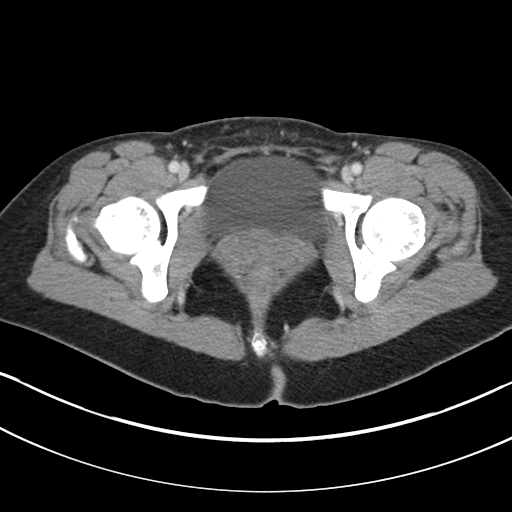
[im 21/82  soft-tissue]
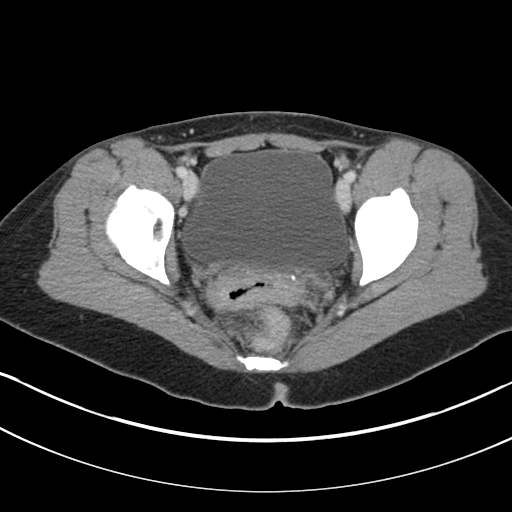
[im 26/82  soft-tissue]
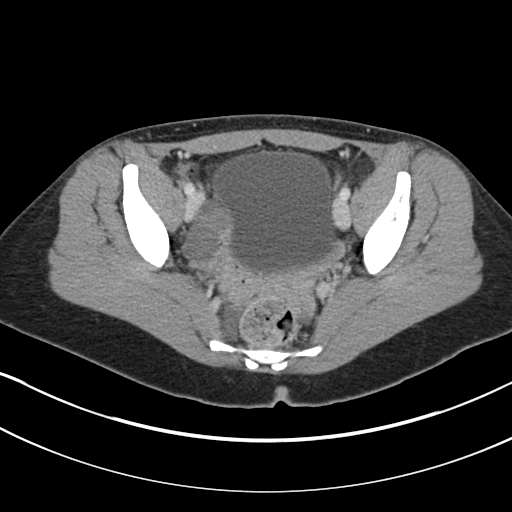
[im 31/82  soft-tissue]
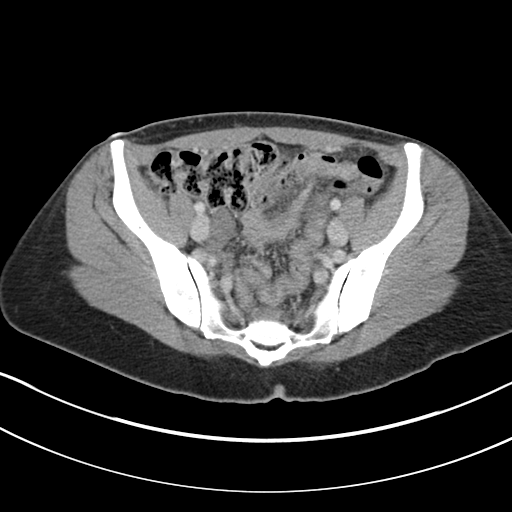
[im 36/82  soft-tissue]
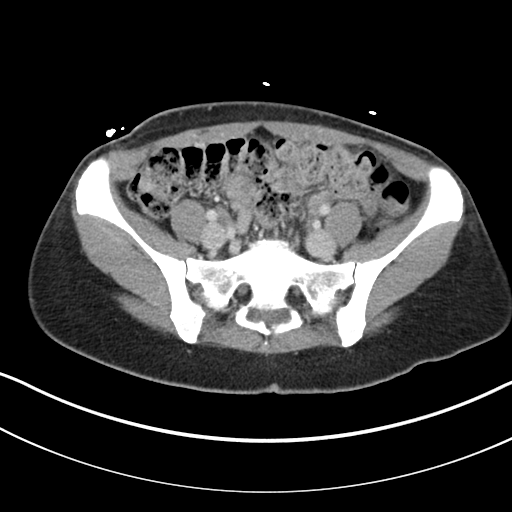
[im 46/82  soft-tissue]
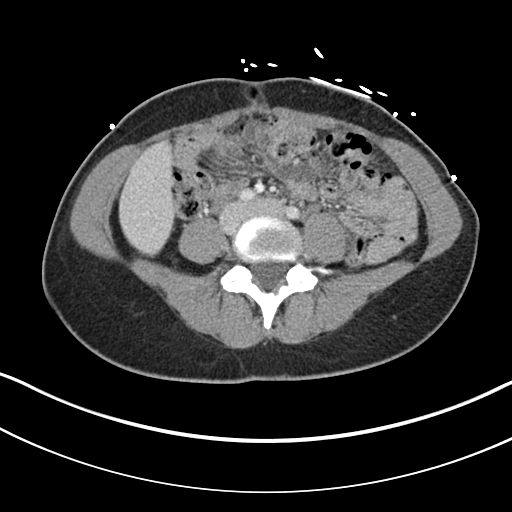
[im 51/82  soft-tissue]
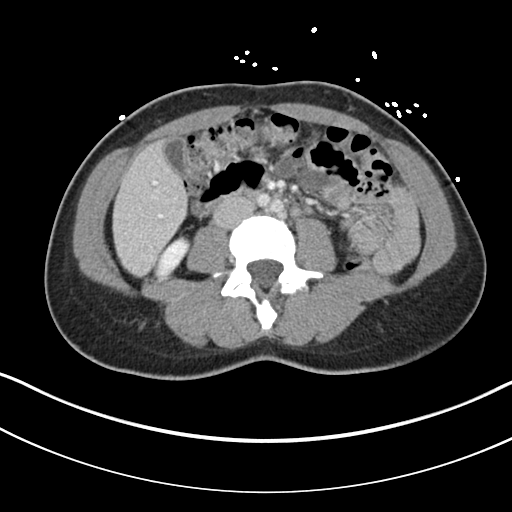
[im 51/82  bone]
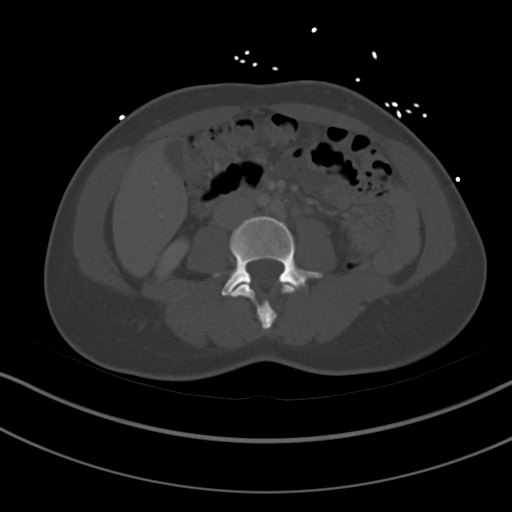
[im 56/82  soft-tissue]
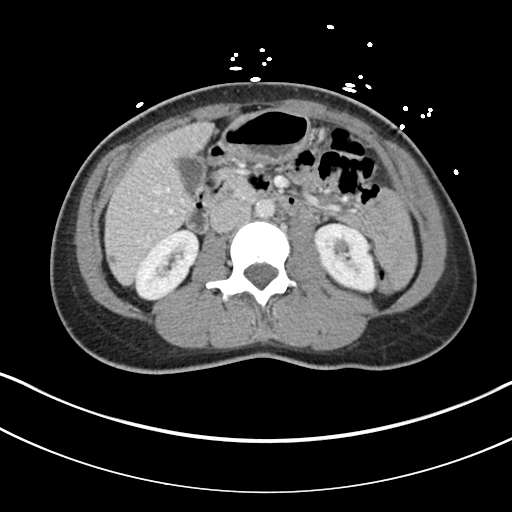
[im 61/82  soft-tissue]
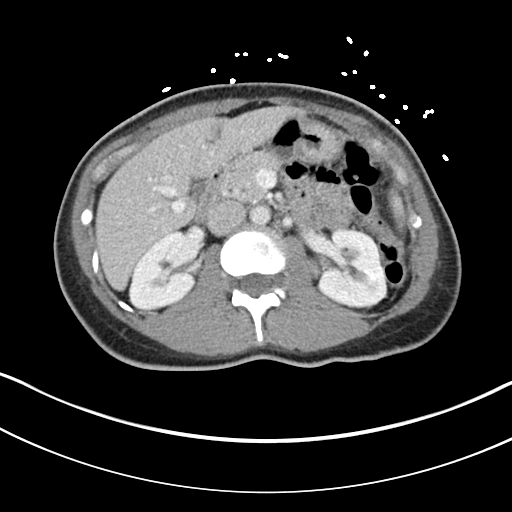
[im 66/82  soft-tissue]
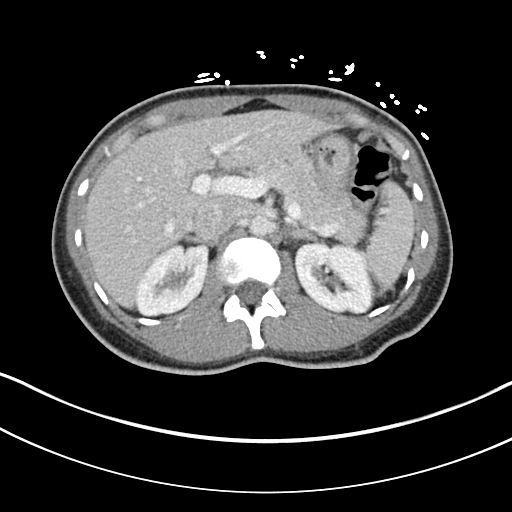
[im 71/82  soft-tissue]
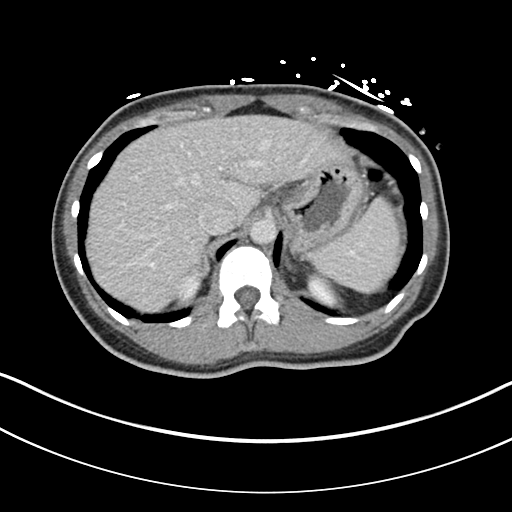
[im 76/82  soft-tissue]
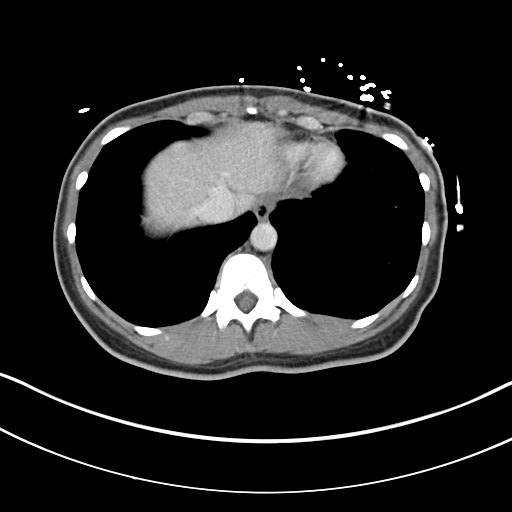

[Series 5: coronal st · coronal · 0.82mm/px · 3 of 137 slices shown]
[im 46/137  soft-tissue]
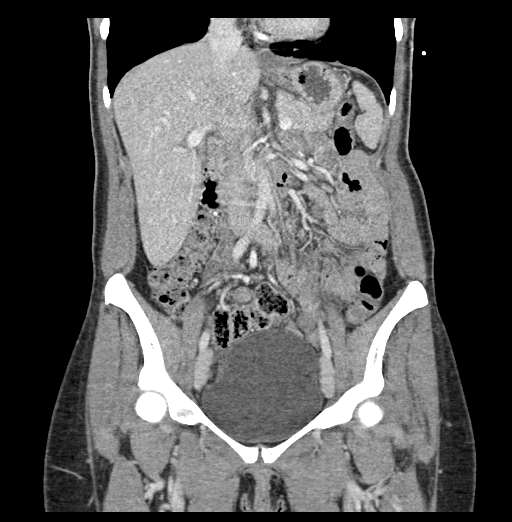
[im 61/137  soft-tissue]
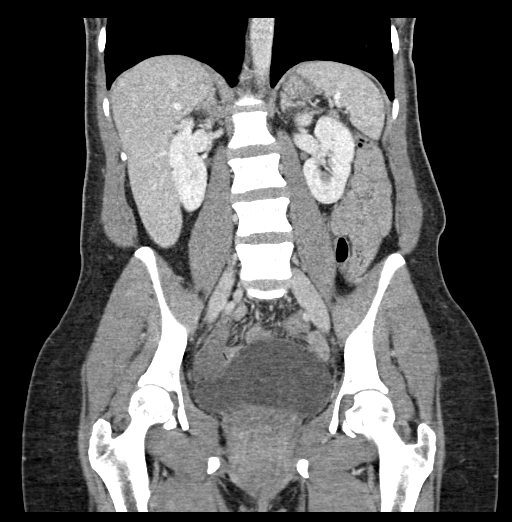
[im 76/137  soft-tissue]
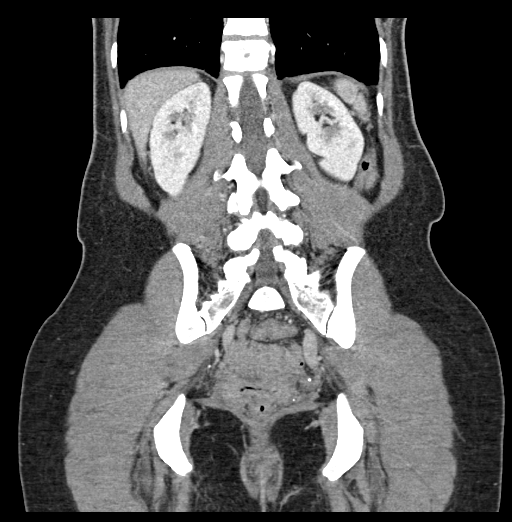

[17 of 46 positions shown; findings below may reference images not displayed]

RADIATION DOSE REDUCTION: This exam was performed according to the
departmental dose-optimization program which includes automated
exposure control, adjustment of the mA and/or kV according to
patient size and/or use of iterative reconstruction technique.

CONTRAST:  100mL OMNIPAQUE IOHEXOL 300 MG/ML  SOLN
FINDINGS: Lower chest: Minimal dependent atelectasis bilaterally. Heart size
normal. No pericardial or pleural effusion. Distal esophagus is
grossly unremarkable.

Hepatobiliary: Subcentimeter low-attenuation lesion in the right
hepatic lobe is too small to characterize but unchanged and
therefore likely benign. Liver is enlarged, 18.6 cm. Liver and
gallbladder are otherwise unremarkable. No biliary ductal
dilatation.

Pancreas: Negative.

Spleen: Negative.

Adrenals/Urinary Tract: Adrenal glands and kidneys are unremarkable.
Ureters are decompressed. Bladder is grossly unremarkable.
Phlebolith in the left gonadal vein simulates a ureteral stone
(2/42), unchanged from 03/16/2020. Bladder is grossly unremarkable.

Stomach/Bowel: Stomach and small bowel are unremarkable. Appendix is
poorly visualized. Colon is unremarkable.

Vascular/Lymphatic: Vascular structures are unremarkable. No
pathologically enlarged lymph nodes.

Reproductive: Uterus is visualized. Probable physiologic cysts in
the right ovary. No adnexal mass.

Other: Small pelvic free fluid. Mesenteries and peritoneum are
otherwise unremarkable.

Musculoskeletal: None.
IMPRESSION: 1. No acute findings to explain the patient's pain.
2. Hepatomegaly.
3. Small pelvic free fluid.

## 2022-10-19 DIAGNOSIS — M25512 Pain in left shoulder: Secondary | ICD-10-CM | POA: Diagnosis not present

## 2022-10-19 DIAGNOSIS — M6281 Muscle weakness (generalized): Secondary | ICD-10-CM | POA: Diagnosis not present

## 2022-10-19 DIAGNOSIS — M799 Soft tissue disorder, unspecified: Secondary | ICD-10-CM | POA: Diagnosis not present

## 2022-10-19 DIAGNOSIS — M25612 Stiffness of left shoulder, not elsewhere classified: Secondary | ICD-10-CM | POA: Diagnosis not present

## 2022-10-25 DIAGNOSIS — M7502 Adhesive capsulitis of left shoulder: Secondary | ICD-10-CM | POA: Diagnosis not present

## 2022-10-27 DIAGNOSIS — M7502 Adhesive capsulitis of left shoulder: Secondary | ICD-10-CM | POA: Diagnosis not present

## 2022-10-30 DIAGNOSIS — M7502 Adhesive capsulitis of left shoulder: Secondary | ICD-10-CM | POA: Diagnosis not present

## 2022-11-02 DIAGNOSIS — M7502 Adhesive capsulitis of left shoulder: Secondary | ICD-10-CM | POA: Diagnosis not present

## 2022-11-03 ENCOUNTER — Ambulatory Visit (HOSPITAL_COMMUNITY): Payer: 59 | Attending: Internal Medicine

## 2022-11-03 DIAGNOSIS — I080 Rheumatic disorders of both mitral and aortic valves: Secondary | ICD-10-CM

## 2022-11-03 DIAGNOSIS — I341 Nonrheumatic mitral (valve) prolapse: Secondary | ICD-10-CM | POA: Insufficient documentation

## 2022-11-03 LAB — ECHOCARDIOGRAM COMPLETE
Area-P 1/2: 4.36 cm2
Calc EF: 57.7 %
S' Lateral: 2.2 cm
Single Plane A2C EF: 56.3 %
Single Plane A4C EF: 60 %

## 2022-11-08 DIAGNOSIS — M7502 Adhesive capsulitis of left shoulder: Secondary | ICD-10-CM | POA: Diagnosis not present

## 2022-11-09 DIAGNOSIS — F4322 Adjustment disorder with anxiety: Secondary | ICD-10-CM | POA: Diagnosis not present

## 2022-11-14 DIAGNOSIS — F4322 Adjustment disorder with anxiety: Secondary | ICD-10-CM | POA: Diagnosis not present

## 2022-11-15 DIAGNOSIS — M7502 Adhesive capsulitis of left shoulder: Secondary | ICD-10-CM | POA: Diagnosis not present

## 2022-11-21 DIAGNOSIS — M7502 Adhesive capsulitis of left shoulder: Secondary | ICD-10-CM | POA: Diagnosis not present

## 2022-11-28 DIAGNOSIS — M7502 Adhesive capsulitis of left shoulder: Secondary | ICD-10-CM | POA: Diagnosis not present

## 2023-03-06 ENCOUNTER — Ambulatory Visit: Payer: 59 | Admitting: Physician Assistant

## 2023-03-12 ENCOUNTER — Encounter: Payer: Self-pay | Admitting: Physician Assistant

## 2023-03-12 ENCOUNTER — Ambulatory Visit: Payer: 59 | Admitting: Physician Assistant

## 2023-03-12 DIAGNOSIS — G4700 Insomnia, unspecified: Secondary | ICD-10-CM

## 2023-03-12 DIAGNOSIS — F411 Generalized anxiety disorder: Secondary | ICD-10-CM | POA: Diagnosis not present

## 2023-03-12 MED ORDER — ALPRAZOLAM 0.5 MG PO TABS: 0.5000 mg | ORAL_TABLET | Freq: Four times a day (QID) | ORAL | 2 refills | Status: DC | PRN

## 2023-03-12 NOTE — Progress Notes (Signed)
[facility] Check  Patient ID: Stephanie Palmer,  MRN: [id number]  PCP: Lewis Moccasin, MD  Date of Evaluation: 03/12/2023 time spent:20 minutes  Chief Complaint:  Chief Complaint   Anxiety; Insomnia; Follow-up    HISTORY/CURRENT STATUS: For routine med check.  Has started E2 about 6 months ago. She's feeling a lot better since starting that. Palpitations have disappeared!  Feels more focused.  Still gets anxious, it's rare that she takes a xanax in the day, but she uses the Xanax every night to help her sleep.   Patient is able to enjoy things.  Energy and motivation are good.  No extreme sadness, tearfulness, or feelings of hopelessness.  ADLs and personal hygiene are normal.   Denies any changes in concentration, making decisions, or remembering things.  Appetite has not changed.  Weight is stable.  Denies suicidal or homicidal thoughts.  Patient denies increased energy with decreased need for sleep, increased talkativeness, racing thoughts, impulsivity or risky behaviors, increased spending, increased libido, grandiosity, increased irritability or anger, paranoia, or hallucinations.  Denies dizziness, syncope, seizures, numbness, tingling, tremor, tics, unsteady gait, slurred speech, confusion. Denies muscle or joint pain, stiffness, or dystonia.  Individual Medical History/ Review of Systems: Changes? :No    Past medications for mental health diagnoses include: Wellbutrin, Adderall, Xanax, Zoloft caused nausea  Allergies: Patient has no known allergies.  Current Medications:  Current Outpatient Medications:    B Complex-Folic Acid (B COMPLEX PLUS) TABS, Take 1 tablet by mouth daily., Disp: 30 tablet, Rfl: 11   Cholecalciferol (VITAMIN D) 50 MCG (2000 UT) CAPS, Take 1 capsule (2,000 Units total) by mouth daily., Disp: 30 capsule, Rfl: 11   estrogens, conjugated, (PREMARIN) 1.25 MG tablet, Take 1.25 mg by mouth daily., Disp: , Rfl:    Multiple Vitamins-Minerals  (MULTIVITAMIN WITH MINERALS) tablet, Take 2 tablets by mouth daily., Disp: , Rfl:    Omega-3 1000 MG CAPS, Take 2,000 mg by mouth daily., Disp: , Rfl:    Probiotic Product (PROBIOTIC PO), Take 1 capsule by mouth daily. , Disp: , Rfl:    ALPRAZolam (XANAX) 0.5 MG tablet, Take 1 tablet (0.5 mg total) by mouth 4 (four) times daily as needed for anxiety., Disp: 120 tablet, Rfl: 2   famotidine (PEPCID) 40 MG tablet, Take 40 mg by mouth daily., Disp: , Rfl:    ibuprofen (ADVIL,MOTRIN) 800 MG tablet, Take 1 tablet (800 mg total) by mouth every 8 (eight) hours., Disp: 30 tablet, Rfl: 0 Medication Side Effects: none  Family Medical/ Social History: Changes? Her Grandfather died recently. 67 yo. Her husband left his job a month or so ago. They're still planning to move to [place] at some point. Already have a condo there.  She's studying for LSAT  MENTAL HEALTH EXAM:  Last menstrual period 05/08/2018.There is no height or weight on file to calculate BMI.  General Appearance: Casual, Neat and Well Groomed  Eye Contact:  Good  Speech:  Clear and Coherent and Normal Rate  Volume:  Normal  Mood:  Euthymic  Affect:  Congruent  Thought Process:  Goal Directed and Descriptions of Associations: Circumstantial  Orientation:  Full (Time, Place, and Person)  Thought Content: Logical   Suicidal Thoughts:  No  Homicidal Thoughts:  No  Memory:  WNL  Judgement:  Good  Insight:  Good  Psychomotor Activity:  Normal  Concentration:  Concentration: Good and Attention Span: Good  Recall:  Good  Fund of Knowledge: Good  Language: Good  Assets:  Desire for Improvement Financial Resources/Insurance Housing Physical Health Resilience Social Support Transportation  ADL's:  Intact  Cognition: WNL  Prognosis:  Good   DIAGNOSES:    ICD-10-CM   1. Generalized anxiety disorder  F41.1     2. Insomnia, unspecified type  G47.00      Receiving Psychotherapy: No   RECOMMENDATIONS:  PDMP reviewed.  Last  Xanax filled 01/12/2023. I provided 20 minutes of face to face time during this encounter, including time spent before and after the visit in records review, medical decision making, counseling pertinent to today's visit, and charting.   She is doing well so no changes will be made. Sleep hygiene discussed.  Continue Xanax 0.5 mg, 1 p.o.q hs prn sleep.  (Written for 4 times daily as needed but she only takes at bedtime.) Continue multivitamin, fish oil, B complex. Return in 6 months.    Melony Overly, PA-C

## 2023-03-30 ENCOUNTER — Telehealth: Payer: Self-pay | Admitting: Physician Assistant

## 2023-03-30 NOTE — Telephone Encounter (Signed)
Pt has a question about the dosage on her xanax. Please call her at [phone]

## 2023-03-30 NOTE — Telephone Encounter (Signed)
She reports that she is fine but is in the middle of planning of a funeral. She feels as though she needs to take her xanax to manage during the day. She usually only takes it once a day at bedtime. The funeral is next week. She is going to run out sooner than she normally would dt taking an additional Xanax during the day. She just wanted to put this on your radar when she does run out, this is why.

## 2023-03-30 NOTE — Telephone Encounter (Signed)
Her Rx is written for qid prn, so she should have enough. If she's needing to double the dose though, she may run out early, I understand and it's ok for an early RF under these circumstances.

## 2023-05-04 ENCOUNTER — Telehealth: Payer: Self-pay | Admitting: Physician Assistant

## 2023-05-04 ENCOUNTER — Other Ambulatory Visit: Payer: Self-pay

## 2023-05-04 MED ORDER — ALPRAZOLAM 0.5 MG PO TABS: 0.5000 mg | ORAL_TABLET | Freq: Four times a day (QID) | ORAL | 0 refills | Status: DC | PRN

## 2023-05-04 NOTE — Telephone Encounter (Signed)
Pt called asking for a refill on her xanax. She needs a new script sent to the [facility] located at [address] in [place]. Please cancel the refill on local one

## 2023-05-04 NOTE — Telephone Encounter (Signed)
Canceled RX at [facility] in [place] and repended to [facility] in [place].

## 2023-06-18 ENCOUNTER — Telehealth: Payer: Self-pay | Admitting: Physician Assistant

## 2023-06-28 ENCOUNTER — Other Ambulatory Visit: Payer: Self-pay | Admitting: Physician Assistant

## 2023-06-28 NOTE — Telephone Encounter (Signed)
Lf 10/29

## 2023-07-06 ENCOUNTER — Telehealth: Payer: Self-pay | Admitting: Physician Assistant

## 2023-07-06 NOTE — Telephone Encounter (Signed)
Per the database it was filled on 2/13, 8 days ago.  Find out when the friend mailed it and if there was any tracking information.  I would not pend a full RF.  I would send message to Rosey Bath and ask her if you should pend a few days or a full RF based on the ship date/tracking info.

## 2023-07-06 NOTE — Telephone Encounter (Signed)
Pt notified office that it had arrived in the mail. No further action needed

## 2023-07-06 NOTE — Telephone Encounter (Signed)
 LVM TO RC.

## 2023-07-06 NOTE — Telephone Encounter (Signed)
[name] called at 9:15 to report that she is out of town.  We sent in her prescription for Xanax and she had a friend pick it up and mail it to her.  However, the mail is delayed and it hasn't even left [place] post office yet.  She is now 2 day without medication and she doesn't know if or when the medication will get to her.  She won't be back for at least 3 weeks.  She is asking for a prescription to sent to [id number] - [place], [place] - [address] AT [facility] OF [address]   She understands there may be restrictions because it was recently picked up, but she needs her medication.

## 2023-07-13 NOTE — Telephone Encounter (Signed)
Error

## 2023-08-01 ENCOUNTER — Telehealth: Payer: Self-pay | Admitting: Physician Assistant

## 2023-08-01 ENCOUNTER — Ambulatory Visit: Admitting: Physician Assistant

## 2023-08-01 NOTE — Telephone Encounter (Signed)
 Pt has a RF available at the requested pharmacy. Tried to call pt but # was always busy.

## 2023-08-01 NOTE — Telephone Encounter (Signed)
 Needs refill on Alprazolam sent to  [id number] Stephanie Palmer, [place] - [address] AT [facility] [address]   Phone: [phone]  Fax: [phone]

## 2023-09-10 ENCOUNTER — Ambulatory Visit (INDEPENDENT_AMBULATORY_CARE_PROVIDER_SITE_OTHER): Payer: 59 | Admitting: Physician Assistant

## 2023-09-10 ENCOUNTER — Encounter: Payer: Self-pay | Admitting: Physician Assistant

## 2023-09-10 DIAGNOSIS — G4700 Insomnia, unspecified: Secondary | ICD-10-CM | POA: Diagnosis not present

## 2023-09-10 DIAGNOSIS — F411 Generalized anxiety disorder: Secondary | ICD-10-CM

## 2023-09-10 MED ORDER — ALPRAZOLAM 0.5 MG PO TABS: 0.5000 mg | ORAL_TABLET | Freq: Four times a day (QID) | ORAL | 5 refills | Status: DC | PRN

## 2023-09-10 NOTE — Progress Notes (Signed)
 [facility]  Patient ID: Stephanie Palmer,  MRN: [id number]  PCP: Aleta Anda, MD  Date of Evaluation: 09/10/2023  Time spent:20 minutes  Chief Complaint:  Chief Complaint   Insomnia; Follow-up    HISTORY/CURRENT STATUS: For routine med check.  Stephanie Palmer is doing well. The Xanax  at night is still effective.  She doesn't use it during the day very often. There has been more stress in her life since she and her husband are planning to move to [place] ( or maybe another state depending on jobs) to be closer to family.  They are dating their house here ready to sell.  Patient is able to enjoy things.  Energy and motivation are good.  No extreme sadness, tearfulness, or feelings of hopelessness.  Sleeps well as long as she has the Xanax . ADLs and personal hygiene are normal.   Denies any changes in concentration, making decisions, or remembering things.  Appetite has not changed.  Weight is stable. Denies suicidal or homicidal thoughts.  Patient denies increased energy with decreased need for sleep, increased talkativeness, racing thoughts, impulsivity or risky behaviors, increased spending, increased libido, grandiosity, increased irritability or anger, paranoia, or hallucinations.  Denies dizziness, syncope, seizures, numbness, tingling, tremor, tics, unsteady gait, slurred speech, confusion. Denies muscle or joint pain, stiffness, or dystonia.  Individual Medical History/ Review of Systems: Changes? :No    Past medications for mental health diagnoses include: Wellbutrin, Adderall, Xanax , Zoloft  caused nausea  Allergies: Patient has no known allergies.  Current Medications:  Current Outpatient Medications:    B Complex-Folic Acid (B COMPLEX PLUS) TABS, Take 1 tablet by mouth daily., Disp: 30 tablet, Rfl: 11   Cholecalciferol (VITAMIN D ) 50 MCG (2000 UT) CAPS, Take 1 capsule (2,000 Units total) by mouth daily., Disp: 30 capsule, Rfl: 11   estrogens, conjugated,  (PREMARIN) 1.25 MG tablet, Take 1.25 mg by mouth daily., Disp: , Rfl:    Multiple Vitamins-Minerals (MULTIVITAMIN WITH MINERALS) tablet, Take 2 tablets by mouth daily., Disp: , Rfl:    Omega-3 1000 MG CAPS, Take 2,000 mg by mouth daily., Disp: , Rfl:    Probiotic Product (PROBIOTIC PO), Take 1 capsule by mouth daily. , Disp: , Rfl:    ALPRAZolam  (XANAX ) 0.5 MG tablet, Take 1 tablet (0.5 mg total) by mouth 4 (four) times daily as needed for anxiety., Disp: 120 tablet, Rfl: 5 Medication Side Effects: none  Family Medical/ Social History: Changes? Probably moving to [place] soon.   MENTAL HEALTH EXAM:  Last menstrual period 05/08/2018.There is no height or weight on file to calculate BMI.  General Appearance: Casual, Neat and Well Groomed  Eye Contact:  Good  Speech:  Clear and Coherent and Normal Rate  Volume:  Normal  Mood:  Euthymic  Affect:  Congruent  Thought Process:  Goal Directed and Descriptions of Associations: Circumstantial  Orientation:  Full (Time, Place, and Person)  Thought Content: Logical   Suicidal Thoughts:  No  Homicidal Thoughts:  No  Memory:  WNL  Judgement:  Good  Insight:  Good  Psychomotor Activity:  Normal  Concentration:  Concentration: Good and Attention Span: Good  Recall:  Good  Fund of Knowledge: Good  Language: Good  Assets:  Communication Skills Desire for Improvement Financial Resources/Insurance Housing Physical Health Resilience Social Support Transportation  ADL's:  Intact  Cognition: WNL  Prognosis:  Good   DIAGNOSES:    ICD-10-CM   1. Generalized anxiety disorder  F41.1     2. Insomnia,  unspecified type  G47.00      Receiving Psychotherapy: No   RECOMMENDATIONS:  PDMP reviewed.  Last Xanax  filled 08/03/2023. I provided  20 minutes of face to face time during this encounter, including time spent before and after the visit in records review, medical decision making, counseling pertinent to today's visit, and charting.   She  is doing well so no changes need to be made.  Continue Xanax  0.5 mg, 1 p.o.q hs prn sleep.  (Written for 4 times daily as needed but she usually only takes at bedtime.) Continue multivitamin, fish oil, B complex. Return in 6 months.    Stephanie Slocumb, PA-C

## 2023-09-21 ENCOUNTER — Ambulatory Visit: Admitting: Physician Assistant

## 2024-01-02 ENCOUNTER — Other Ambulatory Visit: Payer: Self-pay

## 2024-01-02 ENCOUNTER — Telehealth: Payer: Self-pay | Admitting: Physician Assistant

## 2024-01-02 DIAGNOSIS — F411 Generalized anxiety disorder: Secondary | ICD-10-CM

## 2024-01-02 MED ORDER — ALPRAZOLAM 0.5 MG PO TABS: 0.5000 mg | ORAL_TABLET | Freq: Four times a day (QID) | ORAL | 0 refills | Status: DC | PRN

## 2024-01-02 NOTE — Telephone Encounter (Signed)
 Pended

## 2024-01-02 NOTE — Telephone Encounter (Signed)
 Pt called at 9:14a requesting refill of Xanax  to be sent to [facility] [address] [place], [place]. [place], [place]  [id number]  [phone].  This is where she is currently.  Next appt 10/28

## 2024-02-26 ENCOUNTER — Telehealth: Payer: Self-pay | Admitting: Physician Assistant

## 2024-02-26 ENCOUNTER — Other Ambulatory Visit: Payer: Self-pay

## 2024-02-26 DIAGNOSIS — F411 Generalized anxiety disorder: Secondary | ICD-10-CM

## 2024-02-26 MED ORDER — ALPRAZOLAM 0.5 MG PO TABS: 0.5000 mg | ORAL_TABLET | Freq: Four times a day (QID) | ORAL | 0 refills | Status: AC | PRN

## 2024-02-26 NOTE — Telephone Encounter (Signed)
 Pt called asking for a refill on her xanax . [facility]acy is [facility] [place] . Next appt 10/28

## 2024-02-26 NOTE — Telephone Encounter (Signed)
 Pended

## 2024-03-11 ENCOUNTER — Ambulatory Visit: Admitting: Physician Assistant

## 2024-03-11 DIAGNOSIS — Z91199 Patient's noncompliance with other medical treatment and regimen due to unspecified reason: Secondary | ICD-10-CM

## 2024-03-11 NOTE — Progress Notes (Signed)
 No show
